# Patient Record
Sex: Female | Born: 1990 | Race: White | Hispanic: No | Marital: Single | Smoking: Never smoker
Health system: Southern US, Community
[De-identification: ages and names within clinical notes are randomized; demographics above are authoritative.]

## PROBLEM LIST (undated history)

## (undated) ENCOUNTER — Inpatient Hospital Stay: Payer: Self-pay

## (undated) DIAGNOSIS — F909 Attention-deficit hyperactivity disorder, unspecified type: Secondary | ICD-10-CM

## (undated) DIAGNOSIS — F1910 Other psychoactive substance abuse, uncomplicated: Secondary | ICD-10-CM

## (undated) DIAGNOSIS — F329 Major depressive disorder, single episode, unspecified: Secondary | ICD-10-CM

## (undated) DIAGNOSIS — F419 Anxiety disorder, unspecified: Secondary | ICD-10-CM

## (undated) DIAGNOSIS — Z8619 Personal history of other infectious and parasitic diseases: Secondary | ICD-10-CM

## (undated) DIAGNOSIS — F32A Depression, unspecified: Secondary | ICD-10-CM

## (undated) DIAGNOSIS — J45909 Unspecified asthma, uncomplicated: Secondary | ICD-10-CM

## (undated) DIAGNOSIS — A6000 Herpesviral infection of urogenital system, unspecified: Secondary | ICD-10-CM

## (undated) HISTORY — DX: Personal history of other infectious and parasitic diseases: Z86.19

## (undated) HISTORY — DX: Depression, unspecified: F32.A

## (undated) HISTORY — DX: Major depressive disorder, single episode, unspecified: F32.9

## (undated) HISTORY — DX: Unspecified asthma, uncomplicated: J45.909

## (undated) HISTORY — DX: Anxiety disorder, unspecified: F41.9

## (undated) HISTORY — DX: Attention-deficit hyperactivity disorder, unspecified type: F90.9

---
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
## 2012-03-06 LAB — COMPREHENSIVE METABOLIC PANEL
Albumin: 3.8 g/dL (ref 3.8–5.6)
Alkaline Phosphatase: 102 U/L (ref 82–169)
Anion Gap: 6 — ABNORMAL LOW (ref 7–16)
BUN: 11 mg/dL (ref 9–21)
Bilirubin,Total: 0.3 mg/dL (ref 0.2–1.0)
Calcium, Total: 8.7 mg/dL — ABNORMAL LOW (ref 9.0–10.7)
Chloride: 111 mmol/L — ABNORMAL HIGH (ref 97–107)
Co2: 27 mmol/L — ABNORMAL HIGH (ref 16–25)
Creatinine: 0.71 mg/dL (ref 0.60–1.30)
EGFR (African American): >60
EGFR (Non-African Amer.): >60
Glucose: 81 mg/dL (ref 65–99)
Osmolality: 285 (ref 275–301)
Potassium: 3.9 mmol/L (ref 3.3–4.7)
SGOT(AST): 26 U/L (ref 0–26)
SGPT (ALT): 18 U/L (ref 12–78)
Sodium: 144 mmol/L — ABNORMAL HIGH (ref 132–141)
Total Protein: 7 g/dL (ref 6.4–8.6)

## 2012-03-06 LAB — ETHANOL
Ethanol %: <0.003 % (ref 0.000–0.080)
Ethanol: <3 mg/dL

## 2012-03-06 LAB — DRUG SCREEN, URINE

## 2012-03-06 LAB — CBC
HCT: 37 % (ref 35.0–47.0)
HGB: 13 g/dL (ref 12.0–16.0)
MCH: 30.7 pg (ref 26.0–34.0)
MCHC: 35.2 g/dL (ref 32.0–36.0)
MCV: 87 fL (ref 80–100)
Platelet: 255 10*3/uL (ref 150–440)
RBC: 4.24 10*6/uL (ref 3.80–5.20)
RDW: 12.9 % (ref 11.5–14.5)
WBC: 8.3 10*3/uL (ref 3.6–11.0)

## 2012-03-06 LAB — ACETAMINOPHEN LEVEL: Acetaminophen: <2 ug/mL

## 2012-03-06 LAB — TSH: Thyroid Stimulating Horm: 0.21 u[IU]/mL — ABNORMAL LOW

## 2012-03-06 LAB — SALICYLATE LEVEL: Salicylates, Serum: <1.7 mg/dL

## 2012-03-07 ENCOUNTER — Inpatient Hospital Stay: Payer: Self-pay | Admitting: Psychiatry

## 2012-04-12 ENCOUNTER — Emergency Department: Payer: Self-pay | Admitting: Emergency Medicine

## 2012-04-12 LAB — URINALYSIS, COMPLETE
Bacteria: NONE SEEN
Bilirubin,UR: NEGATIVE
Blood: NEGATIVE
Glucose,UR: NEGATIVE mg/dL (ref 0–75)
Ketone: NEGATIVE
Leukocyte Esterase: NEGATIVE
Nitrite: NEGATIVE
Ph: 6 (ref 4.5–8.0)
Protein: NEGATIVE
RBC,UR: 1 /HPF (ref 0–5)
Specific Gravity: 1.02 (ref 1.003–1.030)
Squamous Epithelial: 5
WBC UR: 2 /HPF (ref 0–5)

## 2012-04-12 LAB — COMPREHENSIVE METABOLIC PANEL
Albumin: 4.2 g/dL (ref 3.8–5.6)
Alkaline Phosphatase: 130 U/L (ref 82–169)
Anion Gap: 3 — ABNORMAL LOW (ref 7–16)
BUN: 12 mg/dL (ref 9–21)
Bilirubin,Total: 0.3 mg/dL (ref 0.2–1.0)
Calcium, Total: 9.2 mg/dL (ref 9.0–10.7)
Chloride: 108 mmol/L — ABNORMAL HIGH (ref 97–107)
Co2: 28 mmol/L — ABNORMAL HIGH (ref 16–25)
Creatinine: 0.84 mg/dL (ref 0.60–1.30)
EGFR (African American): >60
EGFR (Non-African Amer.): >60
Glucose: 66 mg/dL (ref 65–99)
Osmolality: 275 (ref 275–301)
Potassium: 4.1 mmol/L (ref 3.3–4.7)
SGOT(AST): 19 U/L (ref 0–26)
SGPT (ALT): 15 U/L (ref 12–78)
Sodium: 139 mmol/L (ref 132–141)
Total Protein: 7.8 g/dL (ref 6.4–8.6)

## 2012-04-12 LAB — CBC
HCT: 41.1 % (ref 35.0–47.0)
HGB: 14.1 g/dL (ref 12.0–16.0)
MCH: 30.2 pg (ref 26.0–34.0)
MCHC: 34.4 g/dL (ref 32.0–36.0)
MCV: 88 fL (ref 80–100)
Platelet: 307 10*3/uL (ref 150–440)
RBC: 4.68 10*6/uL (ref 3.80–5.20)
RDW: 13.4 % (ref 11.5–14.5)
WBC: 10.9 10*3/uL (ref 3.6–11.0)

## 2012-04-12 LAB — LIPASE, BLOOD: Lipase: 165 U/L (ref 73–393)

## 2012-04-16 ENCOUNTER — Ambulatory Visit: Payer: Self-pay | Admitting: Family Medicine

## 2012-06-17 ENCOUNTER — Emergency Department: Payer: Self-pay | Admitting: Emergency Medicine

## 2012-06-18 LAB — URINALYSIS, COMPLETE
Bacteria: NONE SEEN
Bilirubin,UR: NEGATIVE
Blood: NEGATIVE
Glucose,UR: NEGATIVE mg/dL (ref 0–75)
Ketone: NEGATIVE
Leukocyte Esterase: NEGATIVE
Nitrite: NEGATIVE
Ph: 7 (ref 4.5–8.0)
Protein: NEGATIVE
RBC,UR: 3 /HPF (ref 0–5)
Specific Gravity: 1.018 (ref 1.003–1.030)
Squamous Epithelial: 1
WBC UR: <1 /HPF (ref 0–5)

## 2012-06-18 LAB — PREGNANCY, URINE: Pregnancy Test, Urine: NEGATIVE m[IU]/mL

## 2012-06-18 LAB — WET PREP, GENITAL

## 2012-06-27 ENCOUNTER — Emergency Department: Payer: Self-pay | Admitting: Emergency Medicine

## 2012-06-27 LAB — COMPREHENSIVE METABOLIC PANEL
Albumin: 4.1 g/dL (ref 3.8–5.6)
Alkaline Phosphatase: 114 U/L (ref 82–169)
Anion Gap: 5 — ABNORMAL LOW (ref 7–16)
BUN: 10 mg/dL (ref 7–18)
Bilirubin,Total: 0.4 mg/dL (ref 0.2–1.0)
Calcium, Total: 9 mg/dL (ref 9.0–10.7)
Chloride: 106 mmol/L (ref 98–107)
Co2: 28 mmol/L (ref 21–32)
Creatinine: 0.95 mg/dL (ref 0.60–1.30)
EGFR (African American): >60
EGFR (Non-African Amer.): >60
Glucose: 103 mg/dL — ABNORMAL HIGH (ref 65–99)
Osmolality: 277 (ref 275–301)
Potassium: 4.4 mmol/L (ref 3.5–5.1)
SGOT(AST): 19 U/L (ref 0–26)
SGPT (ALT): 20 U/L (ref 12–78)
Sodium: 139 mmol/L (ref 136–145)
Total Protein: 7.7 g/dL (ref 6.4–8.6)

## 2012-06-27 LAB — URINALYSIS, COMPLETE
Bacteria: NONE SEEN
Bilirubin,UR: NEGATIVE
Blood: NEGATIVE
Glucose,UR: NEGATIVE mg/dL (ref 0–75)
Ketone: NEGATIVE
Leukocyte Esterase: NEGATIVE
Nitrite: NEGATIVE
Ph: 5 (ref 4.5–8.0)
Protein: NEGATIVE
RBC,UR: 2 /HPF (ref 0–5)
Specific Gravity: 1.02 (ref 1.003–1.030)
Squamous Epithelial: 20
WBC UR: 2 /HPF (ref 0–5)

## 2012-06-27 LAB — CBC
HCT: 42.2 % (ref 35.0–47.0)
HGB: 14.4 g/dL (ref 12.0–16.0)
MCH: 30.6 pg (ref 26.0–34.0)
MCHC: 34 g/dL (ref 32.0–36.0)
MCV: 90 fL (ref 80–100)
Platelet: 282 10*3/uL (ref 150–440)
RBC: 4.69 10*6/uL (ref 3.80–5.20)
RDW: 13.8 % (ref 11.5–14.5)
WBC: 6.7 10*3/uL (ref 3.6–11.0)

## 2012-06-27 LAB — LIPASE, BLOOD: Lipase: 146 U/L (ref 73–393)

## 2012-06-27 LAB — PREGNANCY, URINE: Pregnancy Test, Urine: NEGATIVE m[IU]/mL

## 2012-06-27 LAB — WET PREP, GENITAL

## 2012-09-26 ENCOUNTER — Emergency Department: Payer: Self-pay | Admitting: Unknown Physician Specialty

## 2012-11-05 ENCOUNTER — Emergency Department: Payer: Self-pay | Admitting: Emergency Medicine

## 2012-11-05 LAB — HCG, QUANTITATIVE, PREGNANCY: Beta Hcg, Quant.: 1045 m[IU]/mL — ABNORMAL HIGH

## 2012-11-13 ENCOUNTER — Emergency Department: Payer: Self-pay | Admitting: Unknown Physician Specialty

## 2012-11-13 LAB — URINALYSIS, COMPLETE
Bilirubin,UR: NEGATIVE
Glucose,UR: NEGATIVE mg/dL (ref 0–75)
Ketone: NEGATIVE
Leukocyte Esterase: NEGATIVE
Nitrite: NEGATIVE
Ph: 6 (ref 4.5–8.0)
Protein: NEGATIVE
RBC,UR: 14 /HPF (ref 0–5)
Specific Gravity: 1.01 (ref 1.003–1.030)
Squamous Epithelial: 15
WBC UR: 6 /HPF (ref 0–5)

## 2012-11-13 LAB — CBC
HCT: 41 % (ref 35.0–47.0)
HGB: 14.2 g/dL (ref 12.0–16.0)
MCH: 31 pg (ref 26.0–34.0)
MCHC: 34.7 g/dL (ref 32.0–36.0)
MCV: 89 fL (ref 80–100)
Platelet: 272 10*3/uL (ref 150–440)
RBC: 4.59 10*6/uL (ref 3.80–5.20)
RDW: 12.8 % (ref 11.5–14.5)
WBC: 9 10*3/uL (ref 3.6–11.0)

## 2012-11-13 LAB — HCG, QUANTITATIVE, PREGNANCY: Beta Hcg, Quant.: 1398 m[IU]/mL — ABNORMAL HIGH

## 2013-01-23 ENCOUNTER — Ambulatory Visit: Payer: Self-pay | Admitting: Obstetrics and Gynecology

## 2013-02-02 ENCOUNTER — Ambulatory Visit: Payer: Self-pay | Admitting: Obstetrics and Gynecology

## 2013-02-27 ENCOUNTER — Emergency Department: Payer: Self-pay | Admitting: Emergency Medicine

## 2013-02-28 ENCOUNTER — Emergency Department: Payer: Self-pay | Admitting: Emergency Medicine

## 2013-02-28 LAB — CBC
HCT: 37.5 % (ref 35.0–47.0)
HGB: 13.3 g/dL (ref 12.0–16.0)
MCH: 31.1 pg (ref 26.0–34.0)
MCHC: 35.4 g/dL (ref 32.0–36.0)
MCV: 88 fL (ref 80–100)
Platelet: 267 10*3/uL (ref 150–440)
RBC: 4.27 10*6/uL (ref 3.80–5.20)
RDW: 12.1 % (ref 11.5–14.5)
WBC: 9.2 10*3/uL (ref 3.6–11.0)

## 2013-02-28 LAB — URINALYSIS, COMPLETE
Bacteria: NONE SEEN
Bilirubin,UR: NEGATIVE
Blood: NEGATIVE
Glucose,UR: NEGATIVE mg/dL (ref 0–75)
Ketone: NEGATIVE
Leukocyte Esterase: NEGATIVE
Nitrite: NEGATIVE
Ph: 6 (ref 4.5–8.0)
Protein: NEGATIVE
RBC,UR: <1 /HPF (ref 0–5)
Specific Gravity: 1.01 (ref 1.003–1.030)
Squamous Epithelial: 1
WBC UR: <1 /HPF (ref 0–5)

## 2013-02-28 LAB — WET PREP, GENITAL

## 2013-02-28 LAB — HCG, QUANTITATIVE, PREGNANCY: Beta Hcg, Quant.: 64000 m[IU]/mL — ABNORMAL HIGH

## 2013-03-01 LAB — GC/CHLAMYDIA PROBE AMP

## 2013-04-14 ENCOUNTER — Emergency Department: Payer: Self-pay | Admitting: Emergency Medicine

## 2013-04-15 LAB — HCG, QUANTITATIVE, PREGNANCY: Beta Hcg, Quant.: 23910 m[IU]/mL — ABNORMAL HIGH

## 2013-06-15 ENCOUNTER — Observation Stay: Payer: Self-pay

## 2013-08-04 ENCOUNTER — Observation Stay: Payer: Self-pay | Admitting: Obstetrics and Gynecology

## 2013-08-04 LAB — URINALYSIS, COMPLETE
Bacteria: NONE SEEN
Bilirubin,UR: NEGATIVE
Blood: NEGATIVE
Glucose,UR: NEGATIVE mg/dL (ref 0–75)
Ketone: NEGATIVE
Leukocyte Esterase: NEGATIVE
Nitrite: NEGATIVE
Ph: 7 (ref 4.5–8.0)
Protein: NEGATIVE
RBC,UR: 1 /HPF (ref 0–5)
Specific Gravity: 1.012 (ref 1.003–1.030)
Squamous Epithelial: 2
WBC UR: 4 /HPF (ref 0–5)

## 2013-08-06 LAB — URINE CULTURE

## 2013-08-27 ENCOUNTER — Observation Stay: Payer: Self-pay

## 2013-09-13 ENCOUNTER — Observation Stay: Payer: Self-pay

## 2013-09-14 ENCOUNTER — Observation Stay: Payer: Self-pay

## 2013-09-27 ENCOUNTER — Observation Stay: Payer: Self-pay

## 2013-09-29 ENCOUNTER — Inpatient Hospital Stay: Payer: Self-pay

## 2013-09-29 LAB — CBC WITH DIFFERENTIAL/PLATELET
Basophil #: 0.1 10*3/uL (ref 0.0–0.1)
Basophil %: 0.9 %
Eosinophil #: 0.3 10*3/uL (ref 0.0–0.7)
Eosinophil %: 2.3 %
HCT: 36.4 % (ref 35.0–47.0)
HGB: 12.4 g/dL (ref 12.0–16.0)
Lymphocyte #: 2 10*3/uL (ref 1.0–3.6)
Lymphocyte %: 13.3 %
MCH: 29.8 pg (ref 26.0–34.0)
MCHC: 33.9 g/dL (ref 32.0–36.0)
MCV: 88 fL (ref 80–100)
Monocyte #: 0.7 x10 3/mm (ref 0.2–0.9)
Monocyte %: 4.6 %
Neutrophil #: 11.8 10*3/uL — ABNORMAL HIGH (ref 1.4–6.5)
Neutrophil %: 78.9 %
Platelet: 224 10*3/uL (ref 150–440)
RBC: 4.14 10*6/uL (ref 3.80–5.20)
RDW: 14.3 % (ref 11.5–14.5)
WBC: 15 10*3/uL — ABNORMAL HIGH (ref 3.6–11.0)

## 2013-09-30 LAB — GC/CHLAMYDIA PROBE AMP

## 2013-10-01 LAB — HEMATOCRIT: HCT: 33.9 % — ABNORMAL LOW (ref 35.0–47.0)

## 2013-10-18 IMAGING — US US OB < 14 WEEKS - US OB TV
1 series · 14 of 28 positions shown · non-contrast
Comparison: none

REASON FOR EXAM: Dates and Viablility
COMMENTS:

[Series 1: us ob < 14 weeks - us ob tv · 0.23mm/px · 14 of 73 slices shown]
[im 3/73]
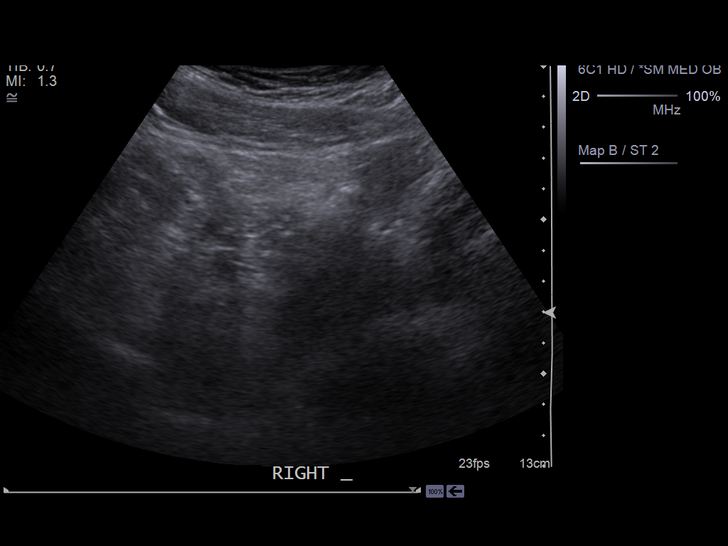
[im 9/73]
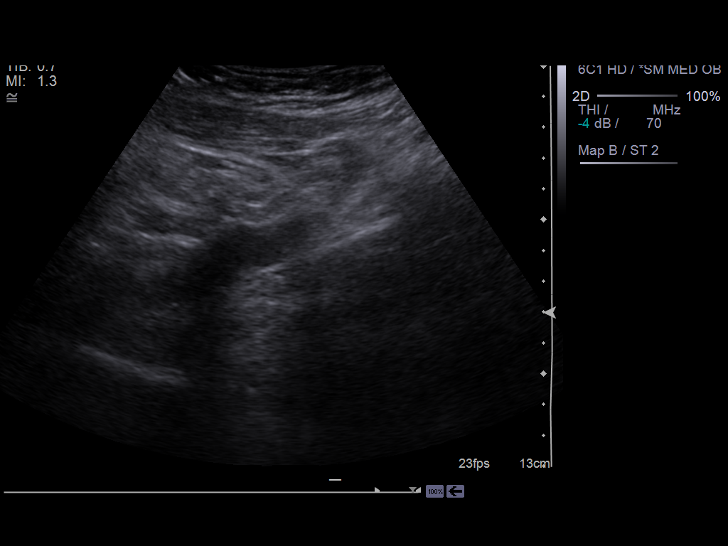
[im 14/73]
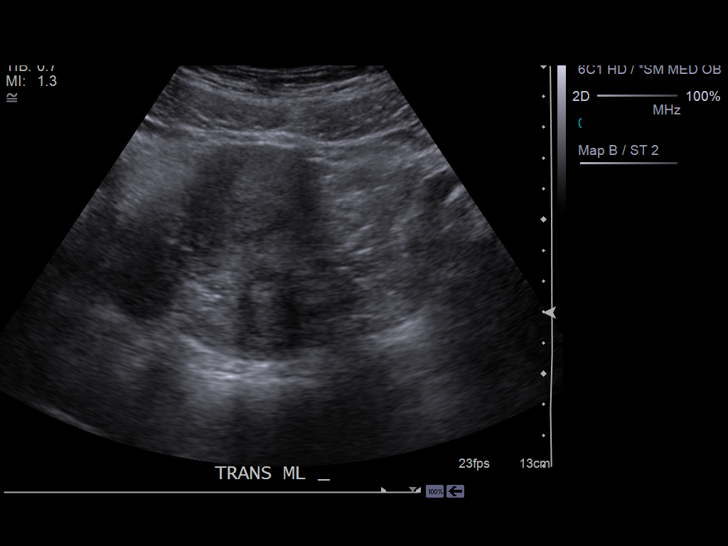
[im 19/73]
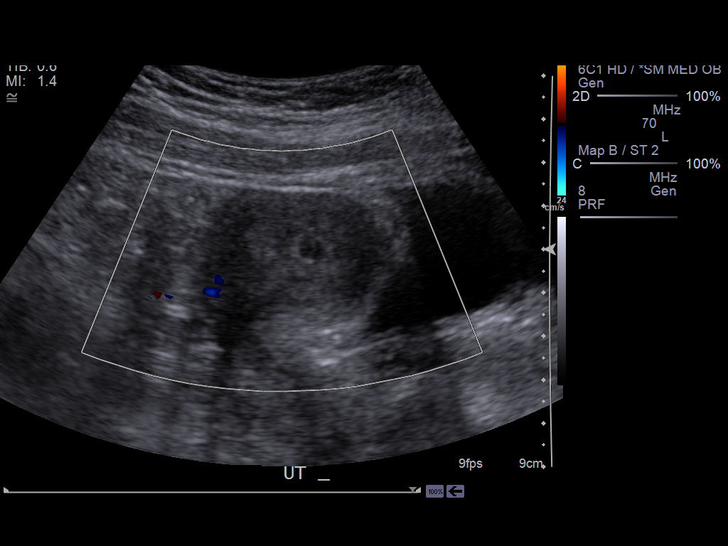
[im 25/73]
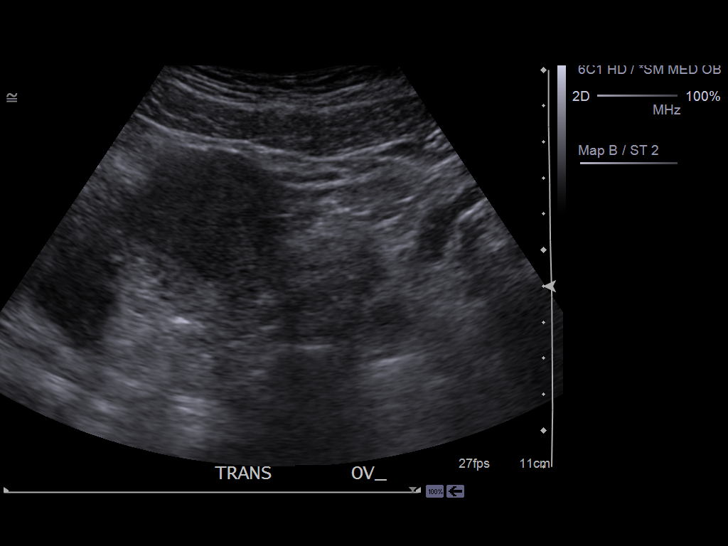
[im 30/73]
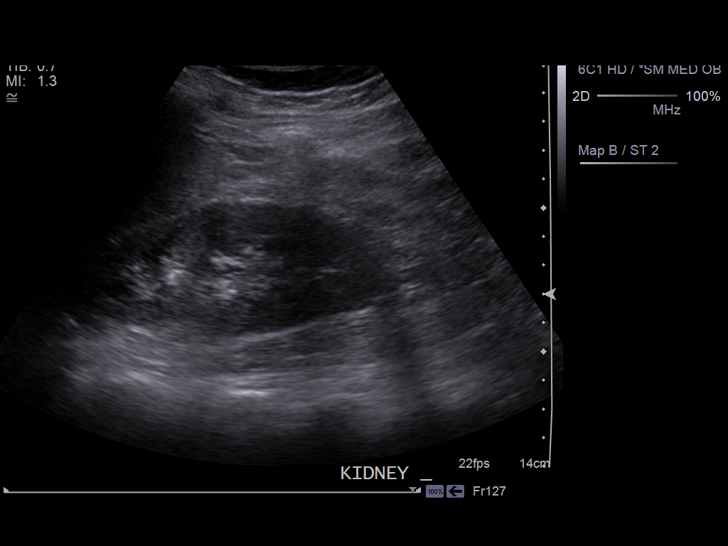
[im 35/73]
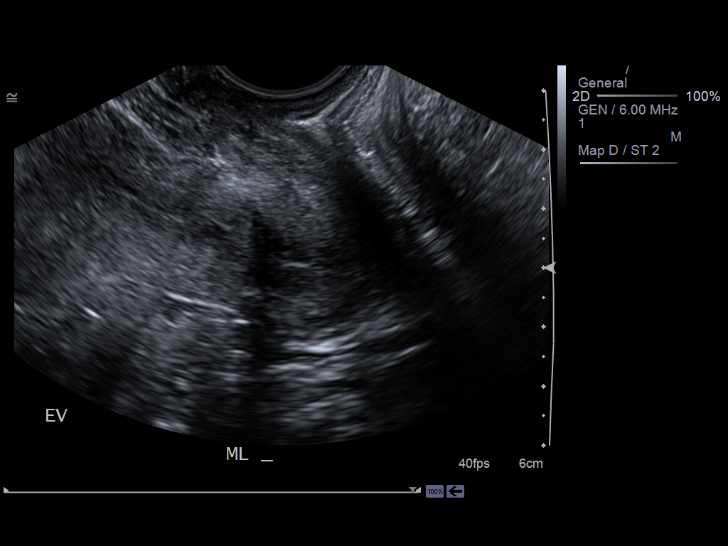
[im 41/73]
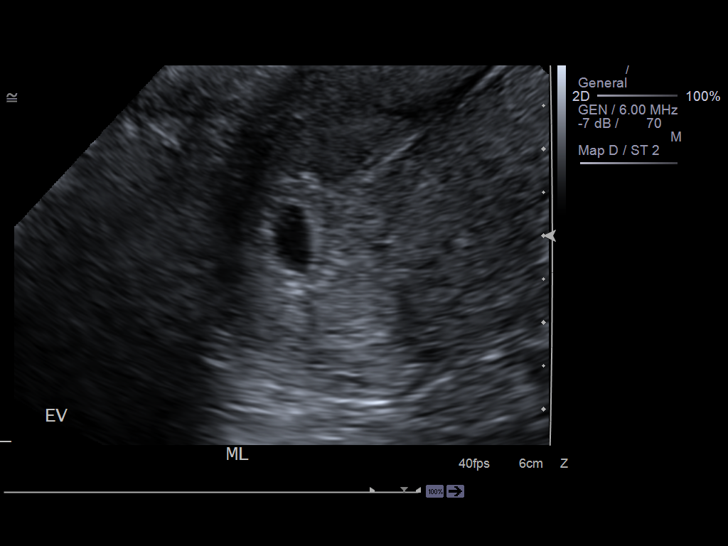
[im 46/73]
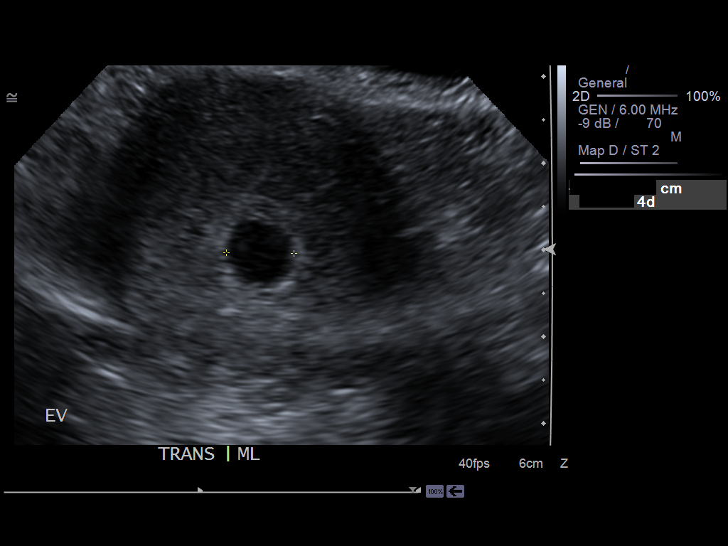
[im 51/73]
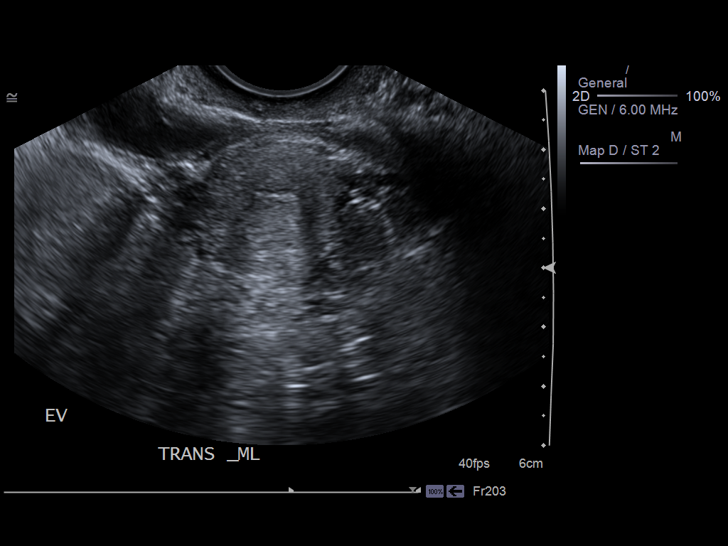
[im 57/73]
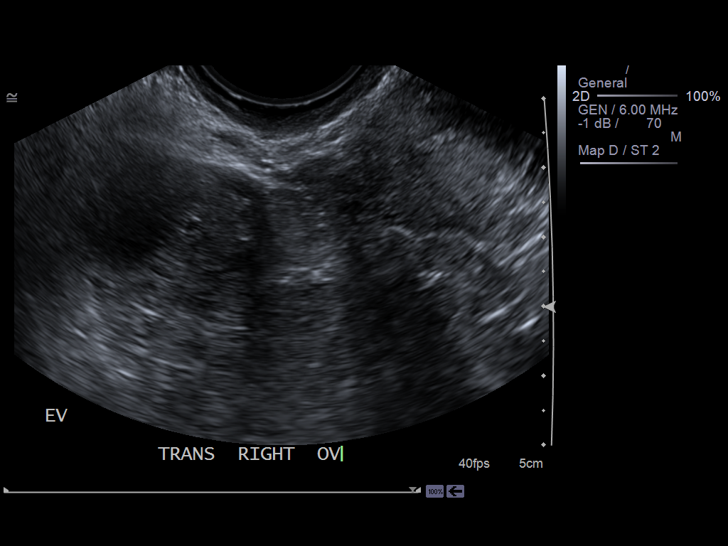
[im 62/73]
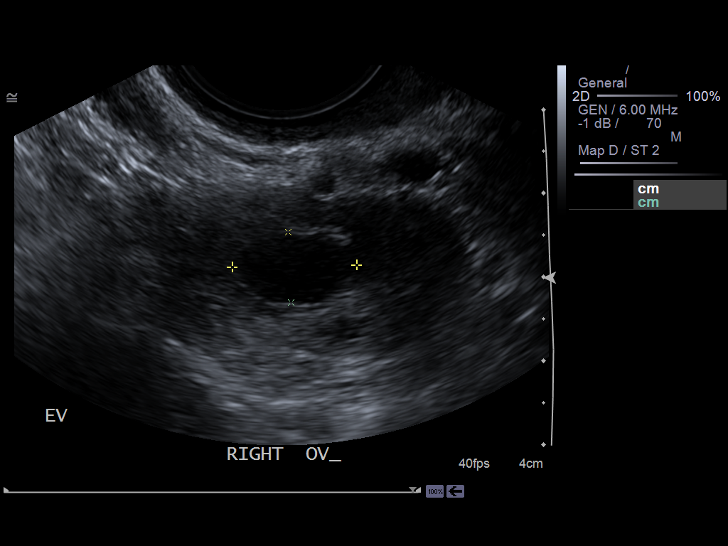
[im 67/73]
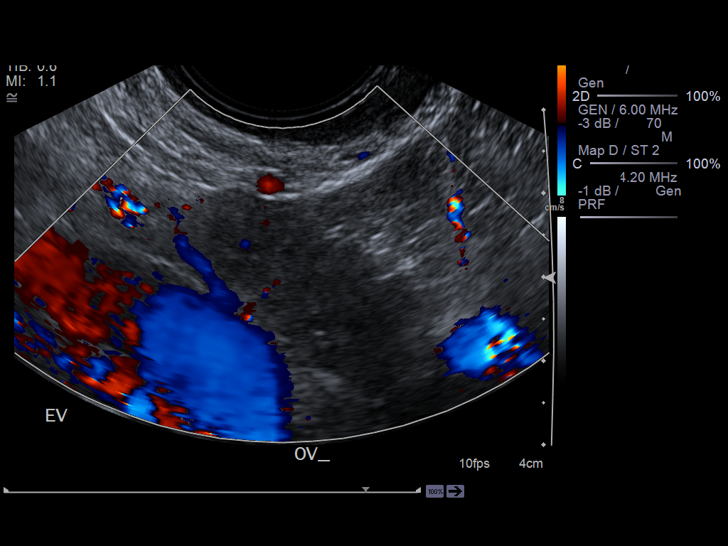
[im 73/73]
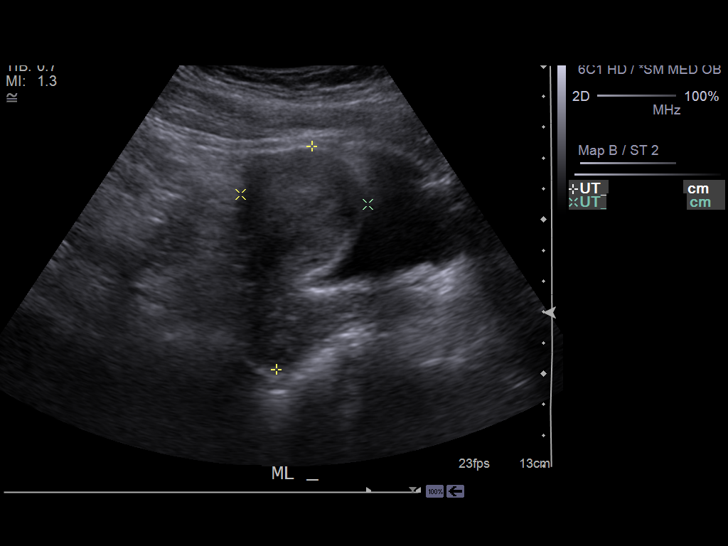

[14 of 28 positions shown; findings below may reference images not displayed]

PROCEDURE:     WHIT - WHIT OB LESS THAN 14 WEEKS W/TV  - January 23, 2013  [DATE]

RESULT:     Sonographic assessment of the pelvis is performed with
transabdominal and endovaginal scanning nabothian cyst cervix measuring up
to 6 mm. There is an appearance consistent with a gestational sac in the
endometrial canal with a yolk sac of 1.9 mm and a gestational sac diameter
measuring up to 6.7 cm mean which is consistent with a 5 week 3 day
gestation. No fetal pole is evident. Serial quantitative beta-hCG
examinations are recommended. Both ovaries are seen. There is a right
ovarian cyst of 1.48 x 0.4 x 1.18 cm. The right ovary measures 2.92 x 2.07 x
3.4 to centimeters. The left ovary measures 1.7 x 1.37 x 3.02 cm. There is
no free fluid evident.
IMPRESSION: Findings suggest early gestational sac in the endometrium
with a yolk sac visualized. No fetal pole is evident. Close clinical and
laboratory correlation and followup is recommended. Small right ovarian cyst
or follicle. Nabothian cyst present.

[REDACTED]

## 2014-02-27 ENCOUNTER — Encounter (HOSPITAL_COMMUNITY): Payer: Self-pay | Admitting: Emergency Medicine

## 2014-02-27 ENCOUNTER — Emergency Department (HOSPITAL_COMMUNITY)
Admission: EM | Admit: 2014-02-27 | Discharge: 2014-02-27 | Disposition: A | Discharge status: Home / Self Care | Payer: Medicaid Other | Attending: Emergency Medicine | Admitting: Emergency Medicine

## 2014-02-27 DIAGNOSIS — R300 Dysuria: Secondary | ICD-10-CM | POA: Diagnosis not present

## 2014-02-27 DIAGNOSIS — Z720 Tobacco use: Secondary | ICD-10-CM | POA: Diagnosis not present

## 2014-02-27 DIAGNOSIS — R350 Frequency of micturition: Secondary | ICD-10-CM | POA: Diagnosis not present

## 2014-02-27 DIAGNOSIS — M545 Low back pain, unspecified: Secondary | ICD-10-CM

## 2014-02-27 DIAGNOSIS — Z3202 Encounter for pregnancy test, result negative: Secondary | ICD-10-CM | POA: Insufficient documentation

## 2014-02-27 DIAGNOSIS — R3915 Urgency of urination: Secondary | ICD-10-CM | POA: Diagnosis not present

## 2014-02-27 DIAGNOSIS — Z8619 Personal history of other infectious and parasitic diseases: Secondary | ICD-10-CM | POA: Insufficient documentation

## 2014-02-27 HISTORY — DX: Herpesviral infection of urogenital system, unspecified: A60.00

## 2014-02-27 LAB — URINE MICROSCOPIC-ADD ON

## 2014-02-27 LAB — URINALYSIS, ROUTINE W REFLEX MICROSCOPIC
Bilirubin Urine: NEGATIVE
Glucose, UA: NEGATIVE mg/dL
Ketones, ur: NEGATIVE mg/dL
Leukocytes, UA: NEGATIVE
Nitrite: NEGATIVE
Protein, ur: NEGATIVE mg/dL
Specific Gravity, Urine: 1.033 — ABNORMAL HIGH (ref 1.005–1.030)
Urobilinogen, UA: 1 mg/dL (ref 0.0–1.0)
pH: 5.5 (ref 5.0–8.0)

## 2014-02-27 MED ORDER — TRAMADOL HCL 50 MG PO TABS
50.0000 mg | ORAL_TABLET | Freq: Once | ORAL | Status: DC
  Filled 2014-02-27: qty 1

## 2014-02-27 MED ORDER — METHOCARBAMOL 500 MG PO TABS
500.0000 mg | ORAL_TABLET | Freq: Once | ORAL | Status: AC
  Administered 2014-02-27: 500 mg via ORAL
  Filled 2014-02-27: qty 1

## 2014-02-27 MED ORDER — HYDROCODONE-ACETAMINOPHEN 5-325 MG PO TABS
2.0000 | ORAL_TABLET | Freq: Once | ORAL | Status: DC
  Filled 2014-02-27: qty 2

## 2014-02-27 NOTE — ED Notes (Signed)
Pt. Stated, I've had back pain and pt. Points to her flank area.

## 2014-02-27 NOTE — ED Notes (Signed)
Pt left without paperwork. Provider aware

## 2014-02-27 NOTE — ED Provider Notes (Signed)
CSN: [id number]     Arrival date & time 02/27/14  0845 History   First MD Initiated Contact with Patient 02/27/14 [phone]     Chief Complaint  Patient presents with  . Back Pain  . Flank Pain     (Consider location/radiation/quality/duration/timing/severity/associated sxs/prior Treatment) HPI Comments: Patient presents today with a chief complaint of left lower back pain.  She reports that she has had the pain intermittently over the past 5 months, but the pain has worsened over the past week.  She states that the pain has been constant over the past week.  She states that one week ago she moved and lifted heavy boxes and furniture.  Pain does not radiate.  She has not taken anything for her pain.  Pain is worse with movement.  She also reports dysuria, increased urinary frequency, and urgency.  She denies fever, chills, nausea, vomiting, hematuria, vaginal discharge, abdominal pain, bowel/bladder incontinence, numbness, or tingling.    Patient is a 23 y.o. female presenting with back pain and flank pain. The history is provided by the patient.  Back Pain Flank Pain    Past Medical History  Diagnosis Date  . Herpes genitalia    History reviewed. No pertinent past surgical history. No family history on file. History  Substance Use Topics  . Smoking status: Current Some Day Smoker  . Smokeless tobacco: Not on file  . Alcohol Use: No   OB History   Grav Para Term Preterm Abortions TAB SAB Ect Mult Living                 Review of Systems  Genitourinary: Positive for flank pain.  Musculoskeletal: Positive for back pain.  All other systems reviewed and are negative.     Allergies  Review of patient's allergies indicates not on file.  Home Medications   Prior to Admission medications   Not on File   BP 112/67  Pulse 85  Temp(Src) 98.1 F (36.7 C) (Oral)  Resp 20  SpO2 96%  LMP 02/13/2014 Physical Exam  Nursing note and vitals reviewed. Constitutional: She appears  well-developed and well-nourished.  HENT:  Head: Normocephalic and atraumatic.  Mouth/Throat: Oropharynx is clear and moist.  Neck: Normal range of motion. Neck supple.  Cardiovascular: Normal rate, regular rhythm and normal heart sounds.   Pulmonary/Chest: Effort normal and breath sounds normal.  Abdominal: Soft. Bowel sounds are normal. She exhibits no distension and no mass. There is no tenderness. There is no rebound and no guarding.  Left CVA tenderness  Musculoskeletal:       Cervical back: She exhibits normal range of motion, no tenderness, no bony tenderness, no swelling, no edema and no deformity.       Thoracic back: She exhibits normal range of motion, no tenderness, no bony tenderness, no swelling, no edema and no deformity.       Lumbar back: She exhibits tenderness and bony tenderness. She exhibits normal range of motion, no swelling, no edema and no deformity.  Lower back pain worsened with flexion, extension, and rotation of the back  Neurological: She is alert. She has normal strength. No sensory deficit. Gait normal.  Reflex Scores:      Patellar reflexes are 2+ on the right side and 2+ on the left side. Skin: Skin is warm and dry.  Psychiatric: She has a normal mood and affect.    ED Course  Procedures (including critical care time) Labs Review Labs Reviewed  URINALYSIS, ROUTINE W REFLEX  MICROSCOPIC - Abnormal; Notable for the following:    Specific Gravity, Urine 1.033 (*)    Hgb urine dipstick SMALL (*)    All other components within normal limits  URINE MICROSCOPIC-ADD ON - Abnormal; Notable for the following:    Squamous Epithelial / LPF FEW (*)    Bacteria, UA FEW (*)    All other components within normal limits  POC URINE PREG, ED    Imaging Review No results found.   EKG Interpretation None     10:24 AM Patient became agitated when she was told that she would not be getting Suboxone in the ED.  Patient offered Tramadol, but she stated that it  would not help.  She then left prior to completion of discharge instructions. MDM   Final diagnoses:  None   Patient presenting with left lower and midline lower back pain.  UA negative.  Pain worse with movement.  Onset of pain after lifting heavy objects.  Suspect muscle strain.   No neurological deficits and normal neuro exam.  Patient able to ambulate without difficulty.  No loss of bowel or bladder control.  No concern for cauda equina.  No fever, night sweats, weight loss, h/o cancer, IVDU.  Patient requesting Suboxone for pain.  Patient offered Tramadol and Robaxin.  She then became very agitated and left the ED prior to completion of discharge paperwork.  Patient ambulated without any difficulty.  Patient stable for discharge.        Santiago GladHeather Laisure, PA-C 02/27/14 1030

## 2014-02-27 NOTE — ED Provider Notes (Signed)
Medical screening examination/treatment/procedure(s) were performed by non-physician practitioner and as supervising physician I was immediately available for consultation/collaboration.   EKG Interpretation None        Scott Zackowski, MD 02/27/14 1647 

## 2014-08-19 ENCOUNTER — Emergency Department
Admit: 2014-08-19 | Disposition: A | Discharge status: Home / Self Care | Payer: Self-pay | Admitting: Emergency Medicine

## 2014-09-07 NOTE — H&P (Signed)
PATIENT NAME:  Tammy FujitaWOODLIEF, Terrilee D MR#:  [id number] DATE OF BIRTH:  1991-04-21  DATE OF ADMISSION:  03/07/2012  IDENTIFYING INFORMATION AND CHIEF COMPLAINT: This is an 24 year old woman with a past history of substance abuse and attention deficit/hyperactivity disorder who was petitioned for admission by her grandmother with an allegation of suicidal threats.   CHIEF COMPLAINT: "I did not try to hurt myself."   HISTORY OF PRESENT ILLNESS: Information obtained from the patient and the chart and also from secondhand testimony of the grandmother. The patient was brought to the Emergency Room yesterday because the patient's grandmother had filed a commitment petition alleging that the patient threatened to kill herself. The patient states that she was riding in a car with her grandmother when they happened to see a man on the street whom the patient had at one point dated. The patient's grandmother disapproves of the fact that the patient dates men of African American background and made some racially disparage comments. This led to an argument between them. The grandmother then apparently decided that she would file a commitment petition. Patient denies that she made any sort of suicidal statements at all or attempted to hurt herself. Grandmother has now called the ward and called my office and stated that in fact she had lied on the petition and that the patient really did not make any suicidal threats. Grandmother admits that she was just angry at the patient. Patient reports that recently her mood has been feeling fairly good. She feels happy with her life. She usually sleeps fairly well. Denies any suicidal or homicidal ideation. Denies any psychotic symptoms. Denies any medical problems. She says that she has been getting along well with her boyfriend which is who she lives with. She claims to be compliant with her medication but is somewhat confused about what it is supposed to be. Apparently she has been  prescribed Suboxone and Vyvanse and Xanax at various times but it also appears that she may have run out of her medicine. It also appears that having run out of it she may get some off the street and abuse it. Patient never really has straight story about this. She denies, however, having overdosed on anything or trying to overuse anything intentionally.   PAST PSYCHIATRIC HISTORY: Apparently has had some degree of behavior problem since childhood. Has been treated for attention deficit/hyperactivity disorder in the past and is supposed to currently be on Vyvanse. Has a history of Percocet abuse in the past and is currently prescribed Suboxone for treatment. She has been prescribed either Klonopin or Xanax by some practitioner, it is not entirely clear for what. Patient has never actually been admitted to the hospital before here. She was once previously about a year ago sent to the Emergency Room under similar circumstances and at that time was also released when her grandmother recanted the petition. Patient denies that she has ever tried to kill herself in the past. Denies any history of violence.   SUBSTANCE ABUSE HISTORY: Says she drinks only rarely and not abusively. Admits that she smokes marijuana fairly regularly. Does not see it as a problem. Says she used to abuse Percocet but now only takes Suboxone. Denies other drug abuse.   PAST MEDICAL HISTORY: Says she has mild asthma, does not use anything for it.   SOCIAL HISTORY: She gets a disability check. She does not work. She lives with her boyfriend. She takes care of a young child who is actually the child of  a friend of hers. Does not have any biological children of her own. She was raised by her grandmother all of her life and appears to still have a very enmeshed and somewhat toxic relationship with her grandmother.   CURRENT MEDICATIONS: Doses cannot be confirmed. She claims she takes Suboxone and Xanax and Vyvanse. Does not know any of the  doses.   ALLERGIES: Penicillin.   REVIEW OF SYSTEMS: Denies depression. Denies suicidal or homicidal ideation. Denies thought disorder or loosening of associations. Denies paranoia. Denies sleep disturbance. Denies any medical problems. The rest of the review of systems all generally negative.   MENTAL STATUS EXAM: Reasonably well groomed young woman, looks her stated age. Neatly but casually dressed. Eye contact is good. Psychomotor activity normal. Speech is a little rapid but not pressured. Affect is mildly anxious but reactive. Mood is stated as being fine. Thoughts can be a little circumstantial but not disorganized, not bizarre, no flight of ideas. Generally able to stay well on the topic. No sign of delusional or paranoid thinking. Denies auditory or visual hallucinations. She denies any suicidal or homicidal ideation. Indicates there are multiple things in her life that she enjoys and looks forward to. Has reasonable judgment and insight. Probably has average intelligence. Short and long-term memory grossly intact. Alert and oriented x4.   LABORATORY, DIAGNOSTIC AND RADIOLOGICAL DATA: Drug screen was positive for opiates, cannabis, and benzodiazepines. TSH was slightly low at 0.21. CBC normal. Alcohol undetectable. Chemistry profile shows a sodium slightly elevated at 144, chloride elevated at 111, calcium elevated at 27. Bilirubin normal. Calcium low at 8.7.   ASSESSMENT: This is an 24 year old woman with a history of substance abuse and attention deficit/hyperactivity disorder who it appears is in a chaotic relationship with a lot of poor boundaries and emotional agitation in her life but is not presenting with suicidal ideation, an acute mood episode or a psychotic episode. She may or may not be abusing street medicine but it difficult to prove sense she also is probably prescribed them. She does use marijuana but denies that it is an addiction or abuse pattern. Patient currently had outpatient  treatment in place and has a safe place to live. Her family, specifically the person who petitioned her, are already recanting their history. Patient at this point does not require further inpatient hospitalization.   TREATMENT PLAN: I discussed this with the treatment team including social workers and nurses and psychologist. At this point we are going to discharge the patient home. She has follow-up appointment already with [facility]. She has been counseled and educated about the importance of staying in treatment to maintain mood stability and not to go back to abusing substances. Also counseled about the desirability of minimizing marijuana use. Patient understands all this and agrees to the outpatient treatment plan. No change to medicine. No new prescriptions given.   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   SECONDARY DIAGNOSES:  AXIS I: Opiate dependence, in partial remission, rule out cannabis abuse.   AXIS II: Deferred.   AXIS III: Mild asthma.   AXIS IV: Moderate. Chronic stress from chaos in her life.   AXIS V: Functioning at time of admission 60.    ____________________________ Audery Amel, MD jtc:cms D: 03/07/2012 15:15:00 ET T: 03/07/2012 15:35:51 ET  JOB#: [id number] cc: Audery Amel, MD, <Dictator>  Audery Amel MD ELECTRONICALLY SIGNED 03/07/2012 15:55

## 2014-09-07 NOTE — Discharge Summary (Signed)
PATIENT NAME:  Tammy FujitaWOODLIEF, Saja D MR#:  [id number] DATE OF BIRTH:  10-18-1990  DATE OF ADMISSION:  03/06/2012 DATE OF DISCHARGE:  03/07/2012  HOSPITAL COURSE: See the dictated History and Physical for all of information about the admission. This 24 year old woman was petitioned for admission by her grandmother, who alleged suicidal ideation. The grandmother has now called and stated that she lied on the petition and recanted all allegations of suicidal threats. The patient consistently denies any suicidal ideation and denies any acute psychiatric syndrome. Behavior is calm. She appears lucid. There is no indication for further hospitalization. There is concern that she may have ongoing substance abuse problems, but she also was prescribed some controlled substances in the community and does not present as having an acute addiction or abuse problem. She is not having any withdrawal complaints currently. The patient has been calm and appropriate and not behaved in a dangerous manner. She [facility]. She has a safe place to live. At this point, she is being discharged because there is no further need for inpatient hospitalization.   DISPOSITION: Discharge back to live with her boyfriend. [facility]   DISCHARGE MEDICATIONS: She says that she takes Suboxone and Vyvanse and Klonopin or Xanax. I am not going to give her any new prescriptions and no medicines were started. She can follow up with her appropriate outpatient providers.   LABORATORY DATA: As dictated in the History and Physical, the chemistry panel just shows a slightly elevated sodium, chloride and CO2 and slightly low calcium. No particular significance is likely. Alcohol was not detected. CBC was normal. TSH was slightly low at 0.21 and has not been repeated. Drug screen was positive for opiates, cannabis, and benzodiazepines.   MENTAL STATUS EXAMINATION:  Neatly dressed young woman who looks her stated age. Alert and oriented x4. Good eye contact, normal psychomotor activity. Slightly rapid speech, not pressured. Thoughts are lucid. No loosening of associations or flight of ideas. Affect is euthymic, reactive, and appropriate. Mood is stated as fine. No evidence of delusional thinking or paranoia. She denies auditory or visual hallucinations. She denies suicidal or homicidal ideation. Reasonable judgment and insight, normal intelligence.   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   SECONDARY DIAGNOSES:  AXIS I:  1. Opiate dependence, in partial remission.  2. Rule out cannabis abuse.   AXIS II: Deferred.   AXIS III: Mild asthma.   AXIS IV: Moderate. Chronic stress from chaos in her life.   AXIS V: Functioning at time of admission 67.   ____________________________ Audery AmelJohn T. Clapacs, MD jtc:cbb D: 03/07/2012 15:19:03 ET T: 03/08/2012 15:53:56 ET JOB#: [id number]  cc: Audery AmelJohn T. Clapacs, MD, <Dictator> Audery AmelJOHN T CLAPACS MD ELECTRONICALLY SIGNED 03/09/2012 15:10

## 2014-09-11 NOTE — Op Note (Signed)
PATIENT NAME:  Tammy [name], Tammy [name] MR#:  [id number] DATE OF BIRTH:  11/24/1990  DATE OF PROCEDURE:  09/30/2013  PREOPERATIVE DIAGNOSES:  1. Postdates pregnancy at 6541 weeks' gestation.  2. Fetal intolerance to labor.   POSTOPERATIVE DIAGNOSES:  1. Postdates pregnancy at 6641 weeks' gestation.  2. Fetal intolerance to labor.  3. Status closed primary low transverse cesarean section.   PROCEDURE: Primary low transverse cesarean section.   SURGEON: Anika S. Valentino Saxonherry, M.[name].   ASSISTANYolanda Bonine: Melody Burr, CNM.   EBL: 500 mL.   INTRAVENOUS FLUIDS: 1100 mL.   URINE OUTPUT: 200 mL.   SPECIMENS: Placenta.   FINDINGS: Included infant delivered from an ROA position, cephalic presentation female with Apgars of 8 at 1 minute and 9 at 5 minutes. Normal-appearing placenta. Clear amniotic fluid was noted. The uterine outline, tubes and ovaries appeared normal.   COMPLICATIONS: None.   PROCEDURE: The patient was taken to the operating room where she was placed under epidural anesthesia which was found to be adequate from prior labor course. A timeout was then performed, and the procedure was verified as C-section delivery. The patient received 900 mg of clindamycin prior to the procedure. Next, the patient was draped and prepped in normal sterile fashion in the dorsal supine position with leftward tilt. The epidural was tested and found to be adequate. A Pfannenstiel incision was then made and carried down through the subcutaneous tissue to the fascia with the Bovie. The fascia was then incised in the midline, and the incision was extended laterally using the Mayo scissors. The superior aspect of the fascial incision was then grasped with the Kocher clamps, elevated and the underlying rectus muscles were dissected off using the Bovie. There was an area of bleeding noted on the left rectus muscle that was controlled by a suture of 0 Vicryl in a figure-of-eight fashion. Next, attention was then turned inferiorly  where in a similar fashion the fascia was grasped, tented up with Kocher clamps and the rectus muscles dissected off bluntly. The rectus muscles were then separated in the midline bluntly. The peritoneum was identified, tented up and entered sharply with Metzenbaum scissors. The peritoneal incision was then extended superiorly and inferiorly with good visualization of the bladder. The uterovesical peritoneal reflection was incised transversely, and a bladder flap was then bluntly freed from the lower uterine segment. The bladder blade was then inserted. A low transverse uterine incision was made along the lower uterine segment with the scalpel. The incision was then extended laterally and superiorly bluntly. The bladder blade was then removed, and the infant's head was delivered atraumatically. No nuchal cord was noted. The nose and mouth were suctioned, and the infant was handed off to the pediatricians present at delivery. The infant was delivered from vertex presentation. It was a 3180 gram female with Apgars of 8 and 8 at 1 and 5 minutes, respectively. After the umbilical cord was clamped and cut, the placenta was then removed manually and intact and appeared normal. The uterus was exteriorized and cleared of all clots and debris. The uterine outline, tubes and ovaries appeared normal. The uterine incision was then closed with running locked sutures of 0 Vicryl. A second layer of imbrication was performed using a running suture of 0 Vicryl. A small area of bleeding was still noted after the second layer, so a figure-of-eight suture was placed on the left lateral edge of the uterine incision. After this, hemostasis was observed. A moist laparotomy sponge was then used to clear  out the gutters of all clots and debris. Next, the fascia was reapproximated using a running suture of 0 Vicryl. The skin was reapproximated using 4-0 Monocryl. A Lidoderm patch was placed above and below the incision site, and the incision  was sealed using Dermabond.  The patient was then taken to the recovery room in stable condition.  Instrument, sponge and needle counts were correct prior to the abdominal closure and at the conclusion of the case.   DISPOSITION: The patient was taken to the PACU hemodynamically stable.   ____________________________ Jacques Earthly. Valentino Saxon, MD asc:gb [name]: 09/30/2013 23:21:55 ET T: 09/30/2013 23:55:47 ET JOB#: [id number]  cc: Jacques Earthly. Valentino Saxon, MD, <Dictator> Fabian November MD ELECTRONICALLY SIGNED 10/05/2013 15:33

## 2014-09-28 NOTE — H&P (Signed)
L&D Evaluation:  History:  HPI 24yo SWF presnts for IOL at 7361w6d EGA, G2P0010, normal PNC to date, with NST and u/s all normal. +HSV with daily prophyylaxis  3 weeks, no recent outbreaks.   Patient's Medical History HSV   Patient's Surgical History D&C   Medications Pre Serbiaatal Vitamins  Tylenol (Acetaminophen)  Acyclovir   Allergies PCN   Social History none   Family History Non-Contributory   ROS:  ROS All systems were reviewed.  HEENT, CNS, GI, GU, Respiratory, CV, Renal and Musculoskeletal systems were found to be normal.   Exam:  Vital Signs stable   General no apparent distress   Mental Status clear   Chest clear   Heart normal sinus rhythm   Abdomen gravid, non-tender   Estimated Fetal Weight Average for gestational age   Fetal Position vtx   Edema no edema   Pelvic 1/40/-2   Mebranes Intact   FHT normal rate with no decels   Fetal Heart Rate 145   Ucx irregular   Ucx Frequency 8 min   Length of each Contraction 30 seconds   Ucx Pain Scale 0   Impression:  Impression Postterm IOL   Plan:  Plan monitor contractions and for cervical change, Cervidil ripening and pitocin IOL in am   Electronic Signatures: Yolanda BonineBurr, Melody N (CNM)  (Signed 12-May-15 21:07)  Authored: L&D Evaluation   Last Updated: 12-May-15 21:07 by Ulyses AmorBurr, Melody N (CNM)

## 2014-11-24 ENCOUNTER — Encounter: Payer: Self-pay | Admitting: *Deleted

## 2014-11-25 ENCOUNTER — Encounter: Payer: Self-pay | Admitting: Obstetrics and Gynecology

## 2014-11-25 ENCOUNTER — Ambulatory Visit (INDEPENDENT_AMBULATORY_CARE_PROVIDER_SITE_OTHER): Payer: Medicaid Other | Admitting: Obstetrics and Gynecology

## 2014-11-25 VITALS — BP 128/84 | HR 88 | Ht 62.0 in | Wt 159.9 lb

## 2014-11-25 DIAGNOSIS — E669 Obesity, unspecified: Secondary | ICD-10-CM

## 2014-11-25 DIAGNOSIS — R1013 Epigastric pain: Secondary | ICD-10-CM

## 2014-11-25 DIAGNOSIS — R197 Diarrhea, unspecified: Secondary | ICD-10-CM | POA: Diagnosis not present

## 2014-11-25 NOTE — Progress Notes (Signed)
Subjective:     Patient ID: Tammy [name], female   DOB: January 25, 1991, 24 y.o.   MRN: [id number]  HPI Reports upper abdominal pains for a few months, sharp that sometimes radiate to back between shoulder blades, and daily diarrhea  Review of Systems See HPI- denies nausea or vomiting, worse after eating, multiple loose or liquid stools daily. Denies fever, dysuria, or constipation.    Objective:   Physical Exam A&O x3 Drowsy and sleeping when I entered the room Abdomen guarding at epigastric area with negative rebound tenderness, but equistly tender on palpation Abdomen otherwise soft without masses    Assessment:     Epigastric pain with diarrhea     Plan:     HIDA scan with upper abd. U/s scheduled for tomorrow Labs obtained  Will f/u according to results  Tammy [name]

## 2014-11-25 NOTE — Patient Instructions (Signed)
Abdominal Pain  Many things can cause abdominal pain. Usually, abdominal pain is not caused by a disease and will improve without treatment. It can often be observed and treated at home. Your health care provider will do a physical exam and possibly order blood tests and X-rays to help determine the seriousness of your pain. However, in many cases, more time must pass before a clear cause of the pain can be found. Before that point, your health care provider may not know if you need more testing or further treatment.  HOME CARE INSTRUCTIONS   Monitor your abdominal pain for any changes. The following actions may help to alleviate any discomfort you are experiencing:   Only take over-the-counter or prescription medicines as directed by your health care provider.   Do not take laxatives unless directed to do so by your health care provider.   Try a clear liquid diet (broth, tea, or water) as directed by your health care provider. Slowly move to a bland diet as tolerated.  SEEK MEDICAL CARE IF:   You have unexplained abdominal pain.   You have abdominal pain associated with nausea or diarrhea.   You have pain when you urinate or have a bowel movement.   You experience abdominal pain that wakes you in the night.   You have abdominal pain that is worsened or improved by eating food.   You have abdominal pain that is worsened with eating fatty foods.   You have a fever.  SEEK IMMEDIATE MEDICAL CARE IF:    Your pain does not go away within 2 hours.   You keep throwing up (vomiting).   Your pain is felt only in portions of the abdomen, such as the right side or the left lower portion of the abdomen.   You pass bloody or black tarry stools.  MAKE SURE YOU:   Understand these instructions.    Will watch your condition.    Will get help right away if you are not doing well or get worse.   Document Released: 02/14/2005 Document Revised: 05/12/2013 Document Reviewed: 01/14/2013  ExitCare Patient Information  2015 [employer]. This information is not intended to replace advice given to you by your health care provider. Make sure you discuss any questions you have with your health care provider.    Gallbladder Nuclear Scanning  This is a test in which a radioactive substance is injected into the blood. The material injected is not harmful to you. This material then concentrates in the bile. Within approximately 1 hour, your gallbladder and the ducts leading to and from it and into the first part of the small bowel, can be visualized by an x-ray scan.   PREPARATION FOR TEST  No preparation or fasting is necessary.  NORMAL FINDINGS  Gallbladder, common bile duct, and duodenum can be visualized within 60 minutes after radionuclide injection. (This confirms the cystic and common bile ducts which lead from the liver and gallbladder are open and allowing bile to pass.)  Ranges for normal findings may vary among different laboratories and hospitals. You should always check with your doctor after having lab work or other tests done to discuss the meaning of your test results and whether your values are considered within normal limits.  MEANING OF TEST   Your caregiver will go over the test results with you and discuss the importance and meaning of your results, as well as treatment options and the need for additional tests if necessary.  OBTAINING THE TEST   04/17/2008 ExitCare Patient Information 2015 [place]. This information is not intended to replace advice given to you by your health care provider. Make sure you discuss any questions you have with your health care provider.

## 2014-11-26 ENCOUNTER — Ambulatory Visit
Admission: RE | Admit: 2014-11-26 | Discharge: 2014-11-26 | Disposition: A | Discharge status: Home / Self Care | Payer: Medicaid Other | Source: Ambulatory Visit | Attending: Obstetrics and Gynecology | Admitting: Obstetrics and Gynecology

## 2014-11-26 ENCOUNTER — Encounter: Admission: RE | Admit: 2014-11-26 | Payer: Medicaid Other | Source: Ambulatory Visit

## 2014-11-26 ENCOUNTER — Other Ambulatory Visit: Payer: Self-pay | Admitting: Obstetrics and Gynecology

## 2014-11-26 DIAGNOSIS — A048 Other specified bacterial intestinal infections: Secondary | ICD-10-CM

## 2014-11-26 DIAGNOSIS — R1013 Epigastric pain: Secondary | ICD-10-CM | POA: Insufficient documentation

## 2014-11-26 LAB — HEPATIC FUNCTION PANEL
ALT: 9 IU/L (ref 0–32)
AST: 14 IU/L (ref 0–40)
Albumin: 4.3 g/dL (ref 3.5–5.5)
Alkaline Phosphatase: 88 IU/L (ref 39–117)
Bilirubin Total: 0.5 mg/dL (ref 0.0–1.2)
Bilirubin, Direct: 0.15 mg/dL (ref 0.00–0.40)
Total Protein: 6.4 g/dL (ref 6.0–8.5)

## 2014-11-26 LAB — AMYLASE: Amylase: 39 U/L (ref 31–124)

## 2014-11-26 LAB — BILE ACIDS, TOTAL: Bile Acids Total: 4.3 umol/L — ABNORMAL LOW (ref 4.7–24.5)

## 2014-11-26 LAB — LIPASE: Lipase: 18 U/L (ref 0–59)

## 2014-11-26 LAB — H. PYLORI ANTIBODY, IGG: H Pylori IgG: 3.3 U/mL — ABNORMAL HIGH (ref 0.0–0.8)

## 2014-11-26 MED ORDER — CLARITHROMYCIN 500 MG PO TABS: 500.0000 mg | ORAL_TABLET | Freq: Two times a day (BID) | ORAL | Status: DC

## 2014-11-26 MED ORDER — METRONIDAZOLE 500 MG PO TABS: 500.0000 mg | ORAL_TABLET | Freq: Two times a day (BID) | ORAL | Status: DC

## 2014-11-26 MED ORDER — PANTOPRAZOLE SODIUM 40 MG PO TBEC: 40.0000 mg | DELAYED_RELEASE_TABLET | Freq: Every day | ORAL | Status: DC

## 2014-11-30 LAB — H. PYLORI ANTIBODY, IGA: H. pylori, IgA Abs: 11.1 units — ABNORMAL HIGH (ref 0.0–8.9)

## 2014-12-02 ENCOUNTER — Telehealth: Payer: Self-pay | Admitting: Obstetrics and Gynecology

## 2014-12-02 NOTE — Telephone Encounter (Signed)
Please let her know we can put her on call list for a cancelation spot if one comes open before then, otherwise will need to wait till then  To apply cold cloths to area and take tylenol or motrin, though I highly doubt Nexplanon is causing her symptoms as long as it has been in.

## 2014-12-02 NOTE — Telephone Encounter (Signed)
Tammy [name] called at lunch and said she wanted her nexplanon taken out, her arm is swollen and hurts.. Your schedule next week is horrible and I told her the first available was Friday 7/29 and she had a fit. She wants to be worked in so I told her i would ask you.

## 2014-12-03 ENCOUNTER — Telehealth: Payer: Self-pay | Admitting: Obstetrics and Gynecology

## 2014-12-03 NOTE — Telephone Encounter (Signed)
Pt was told that Amy was going to call her, pt called on Friday and had not heard from our office, I spoke with Zella BallRobin and Zella BallRobin stated taht Amy was going to call her on Thursday and explaned to her that she would need to make an appt for the next available time that [facility] had that the truble she was having with the nexplanon was not a emergency. I told pt that amy was not here today that I would get there to call her back on Monday and she wanted to know what she was suppose to do, I explained to her that [facility] stated she would need to make an appt at the next available time and that the issue she was having with wanting her nexplanon removed was not an emergency pt state she wanted to go to the health dept.

## 2014-12-07 NOTE — Telephone Encounter (Signed)
Tried calling pt several times na Pt has appt 12/17/2014 will check schedule daily if cancellation will attempt to call pt again

## 2014-12-08 ENCOUNTER — Telehealth: Payer: Self-pay | Admitting: Obstetrics and Gynecology

## 2014-12-08 NOTE — Telephone Encounter (Signed)
PT CALLED AND WANTED YOU TO CALL HER BACK, SHE DID NOT STATE WHY SHE WAS CALLING

## 2014-12-14 NOTE — Telephone Encounter (Signed)
i have tried calling pt several times, left several messages no rtn call back

## 2014-12-16 ENCOUNTER — Other Ambulatory Visit: Payer: Self-pay | Admitting: *Deleted

## 2014-12-16 MED ORDER — ACYCLOVIR 400 MG PO TABS: 400.0000 mg | ORAL_TABLET | Freq: Two times a day (BID) | ORAL | Status: DC

## 2014-12-22 ENCOUNTER — Encounter: Payer: Self-pay | Admitting: Obstetrics and Gynecology

## 2014-12-22 ENCOUNTER — Ambulatory Visit (INDEPENDENT_AMBULATORY_CARE_PROVIDER_SITE_OTHER): Payer: Medicaid Other | Admitting: Obstetrics and Gynecology

## 2014-12-22 VITALS — BP 118/78 | HR 98 | Ht 62.0 in | Wt 160.6 lb

## 2014-12-22 DIAGNOSIS — R300 Dysuria: Secondary | ICD-10-CM

## 2014-12-22 DIAGNOSIS — B9681 Helicobacter pylori [H. pylori] as the cause of diseases classified elsewhere: Secondary | ICD-10-CM | POA: Diagnosis not present

## 2014-12-22 DIAGNOSIS — A048 Other specified bacterial intestinal infections: Secondary | ICD-10-CM

## 2014-12-22 LAB — POCT URINALYSIS DIPSTICK
Glucose, UA: NEGATIVE
Ketones, UA: NEGATIVE
Leukocytes, UA: NEGATIVE
Nitrite, UA: NEGATIVE
Protein, UA: 5
Spec Grav, UA: 1.02
Urobilinogen, UA: 0.2
pH, UA: 5

## 2014-12-22 MED ORDER — NORGESTIM-ETH ESTRAD TRIPHASIC 0.18/0.215/0.25 MG-35 MCG PO TABS: 1.0000 | ORAL_TABLET | Freq: Every day | ORAL | Status: DC

## 2014-12-22 NOTE — Patient Instructions (Signed)
Sexually Transmitted Disease A sexually transmitted disease (STD) is a disease or infection often passed to another person during sex. However, STDs can be passed through nonsexual ways. An STD can be passed through:  Spit (saliva).  Semen.  Blood.  Mucus from the vagina.  Pee (urine). HOW CAN I LESSEN MY CHANCES OF GETTING AN STD?  Use:  Latex condoms.  Water-soluble lubricants with condoms. Do not use petroleum jelly or oils.  Dental dams. These are small pieces of latex that are used as a barrier during oral sex.  Avoid having more than one sex partner.  Do not have sex with someone who has other sex partners.  Do not have sex with anyone you do not know or who is at high risk for an STD.  Avoid risky sex that can break your skin.  Do not have sex if you have open sores on your mouth or skin.  Avoid drinking too much alcohol or taking illegal drugs. Alcohol and drugs can affect your good judgment.  Avoid oral and anal sex acts.  Get shots (vaccines) for HPV and hepatitis.  If you are at risk of being infected with HIV, it is advised that you take a certain medicine daily to prevent HIV infection. This is called pre-exposure prophylaxis (PrEP). You may be at risk if:  You are a man who has sex with other men (MSM).  You are attracted to the opposite sex (heterosexual) and are having sex with more than one partner.  You take drugs with a needle.  You have sex with someone who has HIV.  Talk with your doctor about if you are at high risk of being infected with HIV. If you begin to take PrEP, get tested for HIV first. Get tested every 3 months for as long as you are taking PrEP. WHAT SHOULD I DO IF I THINK I HAVE AN STD?  See your doctor.  Tell your sex partner(s) that you have an STD. They should be tested and treated.  Do not have sex until your doctor says it is okay. WHEN SHOULD I GET HELP? Get help right away if:  You have bad belly (abdominal)  pain.  You are a man and have puffiness (swelling) or pain in your testicles.  You are a woman and have puffiness in your vagina. Document Released: 06/14/2004 Document Revised: 05/12/2013 Document Reviewed: 10/31/2012 ExitCare Patient Information 2015 [employer]. This information is not intended to replace advice given to you by your health care provider. Make sure you discuss any questions you have with your health care provider.  

## 2014-12-22 NOTE — Progress Notes (Signed)
Patient ID: Tammy [name], female   DOB: [birth year], 24 y.o.   MRN: [id number]  desires nexplanon removal and switch to OCPs; also desires STD screen as found out previous partner unfaithful..  Lastly reports persistent lose stools, and is still taking H Pylori medications.  O: A&Ox4 Pelvic exam: normal external genitalia, vulva, vagina, cervix, uterus and adnexa. Microscopic wet-mount exam shows negative for pathogens, normal epithelial cells, KOH done, DNA probe for chlamydia and GC obtained.  Tammy [name] is a 24 y.o. year old Caucasian female here for Implanon removal.  Patient given informed consent for removal of her Implanon.  BP 118/78 mmHg  Pulse 98  Ht  (1.575 m)  Wt 160 lb 9.6 oz (72.848 kg)  BMI 29.37 kg/m2  LMP 12/16/2014  Breastfeeding? No  Appropriate time out taken. Implanon site identified.  Area prepped in usual sterile fashon. One cc of 2% lidocaine was used to anesthetize the area at the distal end of the implant. A small stab incision was made right beside the implant on the distal portion.  The Implanon rod was grasped using hemostats and removed without difficulty.  There was less than 3 cc blood loss. There were no complications.  Steri-strips were applied over the small incision and a pressure bandage was applied.  The patient tolerated the procedure well.  A: nexplanon removal STD screening H Pylori diarrhea  P:  She was instructed to keep the area clean and dry, remove pressure bandage in 24 hours, and keep insertion site covered with the steri-strip for 3-5 days.   Switch to Ortho cyclen- rx sent in Labs obtained for STD screen  Referral to GI  Follow-up PRN problems.  Melody N Burr,CNM

## 2014-12-23 LAB — RPR: RPR Ser Ql: NONREACTIVE

## 2014-12-23 LAB — HELICOBACTER PYLORI  ANTIBODY, IGM: H. pylori, IgM Abs: <9 units (ref 0.0–8.9)

## 2014-12-23 LAB — HIV ANTIBODY (ROUTINE TESTING W REFLEX): HIV Screen 4th Generation wRfx: NONREACTIVE

## 2014-12-23 LAB — H. PYLORI ANTIBODY, IGG: H Pylori IgG: 2.5 U/mL — ABNORMAL HIGH (ref 0.0–0.8)

## 2014-12-23 LAB — HEPATITIS C ANTIBODY: Hep C Virus Ab: <0.1 s/co ratio (ref 0.0–0.9)

## 2014-12-26 LAB — NUSWAB VAGINITIS PLUS (VG+)

## 2014-12-28 LAB — HSV 1 AND 2 IGM ABS, INDIRECT
HSV 1 IgM: <1:10 {titer}
HSV 2 IgM: <1:10 {titer}

## 2015-01-03 ENCOUNTER — Telehealth: Payer: Self-pay | Admitting: *Deleted

## 2015-01-03 NOTE — Telephone Encounter (Signed)
-----   Message from Ulyses Amor, [place] sent at 12/28/2014  3:58 PM EDT ----- Please let her know labs so far are negative for STD exposure- still showing some H Pylori but better, to continue medications and definitely go to GI appt

## 2015-01-03 NOTE — Telephone Encounter (Signed)
Left detailed message for pt about lab results and to keep her appt with GI

## 2015-02-18 ENCOUNTER — Telehealth: Payer: Self-pay | Admitting: Obstetrics and Gynecology

## 2015-02-18 NOTE — Telephone Encounter (Signed)
PT NEED RX REFILL ON ZANEX AND CERAQUIL.

## 2015-02-21 ENCOUNTER — Other Ambulatory Visit: Payer: Self-pay | Admitting: Obstetrics and Gynecology

## 2015-02-21 NOTE — Telephone Encounter (Signed)
pls advise

## 2015-02-22 ENCOUNTER — Other Ambulatory Visit: Payer: Self-pay | Admitting: Obstetrics and Gynecology

## 2015-02-22 MED ORDER — QUETIAPINE FUMARATE 25 MG PO TABS: 25.0000 mg | ORAL_TABLET | Freq: Every day | ORAL | Status: DC

## 2015-02-22 NOTE — Telephone Encounter (Signed)
Please let her know when these are sent in. Refills on both done today

## 2015-02-22 NOTE — Telephone Encounter (Signed)
Notified pt. 

## 2015-04-07 ENCOUNTER — Ambulatory Visit: Payer: Medicaid Other | Admitting: Obstetrics and Gynecology

## 2015-04-28 ENCOUNTER — Ambulatory Visit: Payer: Medicaid Other | Admitting: Obstetrics and Gynecology

## 2015-04-29 ENCOUNTER — Ambulatory Visit (INDEPENDENT_AMBULATORY_CARE_PROVIDER_SITE_OTHER): Payer: Medicaid Other | Admitting: Obstetrics and Gynecology

## 2015-04-29 ENCOUNTER — Encounter: Payer: Self-pay | Admitting: Obstetrics and Gynecology

## 2015-04-29 ENCOUNTER — Other Ambulatory Visit: Payer: Self-pay | Admitting: Obstetrics and Gynecology

## 2015-04-29 VITALS — BP 102/69 | HR 79 | Ht 62.0 in | Wt 152.4 lb

## 2015-04-29 DIAGNOSIS — R300 Dysuria: Secondary | ICD-10-CM | POA: Diagnosis not present

## 2015-04-29 LAB — POCT URINALYSIS DIPSTICK
Glucose, UA: NEGATIVE
Ketones, UA: NEGATIVE
Nitrite, UA: NEGATIVE
Spec Grav, UA: 1.015
Urobilinogen, UA: 0.2
pH, UA: 6.5

## 2015-04-29 MED ORDER — ALPRAZOLAM 1 MG PO TABS: 1.0000 mg | ORAL_TABLET | Freq: Two times a day (BID) | ORAL | Status: DC

## 2015-04-29 MED ORDER — NORGESTIM-ETH ESTRAD TRIPHASIC 0.18/0.215/0.25 MG-35 MCG PO TABS: 1.0000 | ORAL_TABLET | Freq: Every day | ORAL | Status: DC

## 2015-04-29 MED ORDER — QUETIAPINE FUMARATE 50 MG PO TABS: 50.0000 mg | ORAL_TABLET | Freq: Every day | ORAL | Status: DC

## 2015-04-29 NOTE — Progress Notes (Signed)
Subjective:     Patient ID: Tammy [name], female   DOB: Oct 03, 1990, 24 y.o.   MRN: [id number]  HPI  Burning with urination x 1 week Wants STD screening just to be sure, on menses.   Review of Systems See above. Also needs refill on medications- is now taking 2 xanax daily and doesn't feel like seroquel is working as well, but also doesn't feel like she needs it every night either.    Objective:   Physical Exam A&O x4  no apparent distress Abdomen soft and nontender Pelvic exam: normal external genitalia, vulva, vagina, cervix, uterus and adnexa, menstrual bleeding noted.    Assessment:     Dysuria STD screen Anxiety Occasional sleep disturbance  medication refills     Plan:     Urine sent for culture and GC/CMZ Xanax refilled and increase Seroquel dose to 50mg  for prn use. RTC in 4 months for Annual or prn.  Melody ElkhornShambley, CNM

## 2015-04-30 LAB — URINE CULTURE

## 2015-05-02 LAB — GC/CHLAMYDIA PROBE AMP
Chlamydia trachomatis, NAA: NEGATIVE
Neisseria gonorrhoeae by PCR: NEGATIVE

## 2015-05-03 ENCOUNTER — Telehealth: Payer: Self-pay | Admitting: *Deleted

## 2015-05-03 NOTE — Telephone Encounter (Signed)
Notified pt of results 

## 2015-05-03 NOTE — Telephone Encounter (Signed)
-----   Message from Purcell NailsMelody N Shambley, [place] sent at 05/03/2015  2:39 PM EST ----- Please let her know STD screen negative
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
## 2015-05-22 NOTE — L&D Delivery Note (Signed)
Delivery Summary for Tammy FujitaJessica D Yeo  Labor Events:   Preterm labor:   Rupture date:   Rupture time:   Rupture type: Intact  Fluid Color: Yellow  Induction:   Augmentation:   Complications:   Cervical ripening:          Delivery:   Episiotomy:   Lacerations:   Repair suture:   Repair # of packets:   Blood loss (ml):    Information for the patient's newborn:  Robin SearingWoodlief, Girl Kailie [[id number]][[id number]]    Delivery 02/16/2016 2:35 PM by  C-Section, Low Transverse Sex:  female Gestational Age: 1064w0d Delivery Clinician:   Living?:         APGARS  One minute Five minutes Ten minutes  Skin color:        Heart rate:        Grimace:        Muscle tone:        Breathing:        Totals: 8  9      Presentation/position:      Resuscitation:   Cord information:    Disposition of cord blood:     Blood gases sent?  Complications:   Placenta: Delivered:       appearance Newborn Measurements: Weight: 6 lb 12.6 oz (3080 g)  Height: 19.49"  Head circumference:    Chest circumference:    Other providers:    Additional  information: Forceps:   Vacuum:   Breech:   Observed anomalies        See Dr. Oretha Milchherry's C-section operative note for details of delivery.    Hildred LaserAnika Cherry, MD [facility]

## 2015-07-22 ENCOUNTER — Ambulatory Visit: Payer: Medicaid Other | Admitting: Obstetrics and Gynecology

## 2015-07-22 VITALS — BP 111/77 | HR 60 | Wt 147.0 lb

## 2015-07-22 DIAGNOSIS — Z369 Encounter for antenatal screening, unspecified: Secondary | ICD-10-CM

## 2015-07-22 DIAGNOSIS — Z3687 Encounter for antenatal screening for uncertain dates: Secondary | ICD-10-CM

## 2015-07-22 DIAGNOSIS — Z3490 Encounter for supervision of normal pregnancy, unspecified, unspecified trimester: Secondary | ICD-10-CM

## 2015-07-22 DIAGNOSIS — Z1389 Encounter for screening for other disorder: Secondary | ICD-10-CM

## 2015-07-22 DIAGNOSIS — Z113 Encounter for screening for infections with a predominantly sexual mode of transmission: Secondary | ICD-10-CM

## 2015-07-22 NOTE — Progress Notes (Signed)
  Tammy FujitaJessica D [name] presents for NOB nurse interview visit. G-3.  P-1011. Pregnancy education material explained and given. No cats in the home. NOB labs ordered. HIV labs and Drug screen were explained optional and she could opt out of tests but did not decline. Drug screen ordered. PNV encouraged. NT to discuss with provider. Pt. To follow up with provider after dating and viability ultrasound done. LMP pt is unsure. Sometime in December which makes pt 12 wks if this is correct.  All questions answered. Pt states Dr. Lacie ScottsNiemeyer gave her an ATB for UTI and stopped taking it because it made her sick. Will send urine off for ua and uc. Also pt c/o spotting a little. Informed pt this is not uncommon and if increasing to like a period and severe abd pain, call office or go to ER.    ZIKA EXPOSURE SCREEN:  The patient has not traveled to a [place] endemic area within the past 6 months, nor has she had unprotected sex with a partner who has travelled to a [place] endemic region within the past 6 months. The patient has been advised to notify us if these factors change any time during this current pregnancy, so adequate testing and monitoring can be initiated.

## 2015-07-23 LAB — PAIN MGT SCRN (14 DRUGS), UR
Amphetamine Screen, Ur: NEGATIVE ng/mL
Barbiturate Screen, Ur: NEGATIVE ng/mL
Benzodiazepine Screen, Urine: NEGATIVE ng/mL
Buprenorphine, Urine: POSITIVE ng/mL
Cannabinoids Ur Ql Scn: POSITIVE ng/mL
Cocaine(Metab.)Screen, Urine: NEGATIVE ng/mL
Creatinine(Crt), U: 242.8 mg/dL (ref 20.0–300.0)
Fentanyl, Urine: NEGATIVE pg/mL
Meperidine Screen, Urine: NEGATIVE ng/mL
Methadone Scn, Ur: NEGATIVE ng/mL
Opiate Scrn, Ur: NEGATIVE ng/mL
Oxycodone+Oxymorphone Ur Ql Scn: NEGATIVE ng/mL
PCP Scrn, Ur: NEGATIVE ng/mL
Ph of Urine: 5.7 (ref 4.5–8.9)
Propoxyphene, Screen: NEGATIVE ng/mL
Tramadol Ur Ql Scn: NEGATIVE ng/mL

## 2015-07-23 LAB — NICOTINE SCREEN, URINE: Cotinine Ql Scrn, Ur: NEGATIVE ng/mL

## 2015-07-23 LAB — URINALYSIS, ROUTINE W REFLEX MICROSCOPIC
Bilirubin, UA: NEGATIVE
Ketones, UA: NEGATIVE
Leukocytes, UA: NEGATIVE
Nitrite, UA: NEGATIVE
RBC, UA: NEGATIVE
Specific Gravity, UA: 1.024 (ref 1.005–1.030)
Urobilinogen, Ur: 0.2 mg/dL (ref 0.2–1.0)
pH, UA: 6.5 (ref 5.0–7.5)

## 2015-07-24 LAB — CULTURE, OB URINE

## 2015-07-24 LAB — URINE CULTURE, OB REFLEX

## 2015-07-25 LAB — GC/CHLAMYDIA PROBE AMP
Chlamydia trachomatis, NAA: NEGATIVE
Neisseria gonorrhoeae by PCR: NEGATIVE

## 2015-07-26 ENCOUNTER — Other Ambulatory Visit: Payer: Self-pay | Admitting: Obstetrics and Gynecology

## 2015-07-26 DIAGNOSIS — O9989 Other specified diseases and conditions complicating pregnancy, childbirth and the puerperium: Principal | ICD-10-CM

## 2015-07-26 DIAGNOSIS — F1290 Cannabis use, unspecified, uncomplicated: Secondary | ICD-10-CM | POA: Insufficient documentation

## 2015-07-26 DIAGNOSIS — Z283 Underimmunization status: Secondary | ICD-10-CM | POA: Insufficient documentation

## 2015-07-26 DIAGNOSIS — Z2839 Other underimmunization status: Secondary | ICD-10-CM | POA: Insufficient documentation

## 2015-07-26 DIAGNOSIS — O09899 Supervision of other high risk pregnancies, unspecified trimester: Secondary | ICD-10-CM | POA: Insufficient documentation

## 2015-07-26 LAB — CBC WITH DIFFERENTIAL/PLATELET
Basophils Absolute: 0 10*3/uL (ref 0.0–0.2)
Basos: 0 %
EOS (ABSOLUTE): 0.2 10*3/uL (ref 0.0–0.4)
Eos: 3 %
Hematocrit: 38.2 % (ref 34.0–46.6)
Hemoglobin: 13.5 g/dL (ref 11.1–15.9)
Immature Grans (Abs): 0 10*3/uL (ref 0.0–0.1)
Immature Granulocytes: 0 %
Lymphocytes Absolute: 1.6 10*3/uL (ref 0.7–3.1)
Lymphs: 22 %
MCH: 30.4 pg (ref 26.6–33.0)
MCHC: 35.3 g/dL (ref 31.5–35.7)
MCV: 86 fL (ref 79–97)
Monocytes Absolute: 0.4 10*3/uL (ref 0.1–0.9)
Monocytes: 5 %
Neutrophils Absolute: 5 10*3/uL (ref 1.4–7.0)
Neutrophils: 70 %
Platelets: 269 10*3/uL (ref 150–379)
RBC: 4.44 x10E6/uL (ref 3.77–5.28)
RDW: 13.4 % (ref 12.3–15.4)
WBC: 7.2 10*3/uL (ref 3.4–10.8)

## 2015-07-26 LAB — RUBELLA ANTIBODY, IGM: Rubella IgM: <20 AU/mL (ref 0.0–19.9)

## 2015-07-26 LAB — HEPATITIS B SURFACE ANTIGEN: Hepatitis B Surface Ag: NEGATIVE

## 2015-07-26 LAB — RH TYPE: Rh Factor: POSITIVE

## 2015-07-26 LAB — RPR: RPR Ser Ql: NONREACTIVE

## 2015-07-26 LAB — HIV ANTIBODY (ROUTINE TESTING W REFLEX): HIV Screen 4th Generation wRfx: NONREACTIVE

## 2015-07-26 LAB — VARICELLA ZOSTER ANTIBODY, IGM: Varicella IgM: 0.92 index — ABNORMAL HIGH (ref 0.00–0.90)

## 2015-07-26 LAB — ANTIBODY SCREEN: Antibody Screen: NEGATIVE

## 2015-07-26 LAB — ABO

## 2015-07-27 ENCOUNTER — Ambulatory Visit (INDEPENDENT_AMBULATORY_CARE_PROVIDER_SITE_OTHER): Payer: Medicaid Other

## 2015-07-27 DIAGNOSIS — Z36 Encounter for antenatal screening of mother: Secondary | ICD-10-CM

## 2015-07-27 DIAGNOSIS — Z3687 Encounter for antenatal screening for uncertain dates: Secondary | ICD-10-CM

## 2015-08-24 ENCOUNTER — Encounter: Payer: Self-pay | Admitting: Obstetrics and Gynecology

## 2015-08-24 ENCOUNTER — Encounter: Payer: Medicaid Other | Admitting: Obstetrics and Gynecology

## 2015-08-25 ENCOUNTER — Ambulatory Visit (INDEPENDENT_AMBULATORY_CARE_PROVIDER_SITE_OTHER): Payer: Medicaid Other | Admitting: Obstetrics and Gynecology

## 2015-08-25 ENCOUNTER — Encounter: Payer: Self-pay | Admitting: Obstetrics and Gynecology

## 2015-08-25 VITALS — BP 92/67 | HR 79 | Wt 146.8 lb

## 2015-08-25 DIAGNOSIS — Z331 Pregnant state, incidental: Secondary | ICD-10-CM

## 2015-08-25 LAB — POCT URINALYSIS DIPSTICK
Bilirubin, UA: NEGATIVE
Blood, UA: NEGATIVE
Glucose, UA: NEGATIVE
Ketones, UA: NEGATIVE
Leukocytes, UA: NEGATIVE
Nitrite, UA: NEGATIVE
Protein, UA: NEGATIVE
Spec Grav, UA: 1.01
Urobilinogen, UA: 0.2
pH, UA: 6.5

## 2015-08-25 NOTE — Progress Notes (Signed)
OB work in- pt is c/o discharge, she is also hurting in her abdomen

## 2015-08-25 NOTE — Patient Instructions (Signed)

## 2015-08-25 NOTE — Progress Notes (Signed)
NEW OB HISTORY AND PHYSICAL  SUBJECTIVE:       Tammy [name] is a 25 y.o. G64P0011 female, Patient's last menstrual period was 05/19/2015., Estimated Date of Delivery: 02/23/16, [redacted]w[redacted]d, presents today for establishment of Prenatal Care. She has no unusual complaints and complains of nausea and increased discharge      Gynecologic History Patient's last menstrual period was 05/19/2015. Unknown Contraception: none Last Pap: 2016. Results were: normal  Obstetric History OB History  Gravida Para Term Preterm AB SAB TAB Ectopic Multiple Living  # Outcome Date GA Lbr Len/2nd Weight Sex Delivery Anes PTL Lv  3 Current           2 Gravida 09/30/13    F CS-Unspec   Y  1 SAB 2014              Past Medical History  Diagnosis Date  . Herpes genitalia   . Asthma   . Anxiety   . Depression   . ADHD (attention deficit hyperactivity disorder)   . History of chlamydia     Past Surgical History  Procedure Laterality Date  . Peg tube placement Bilateral 2011  . Tonsillectomy  2011    Current Outpatient Prescriptions on File Prior to Visit  Medication Sig Dispense Refill  . Prenatal Vit-Fe Fumarate-FA (PRENATAL MULTIVITAMIN) TABS tablet Take 1 tablet by mouth daily at 12 noon. Reported on 08/25/2015    . acyclovir (ZOVIRAX) 400 MG tablet Take 1 tablet (400 mg total) by mouth 2 (two) times daily. (Patient not taking: Reported on 07/22/2015) 60 tablet 3   No current facility-administered medications on file prior to visit.    Allergies  Allergen Reactions  . Hydrocodone-Acetaminophen Itching  . Morphine And Related Itching  . Peanuts [Peanut Oil] Swelling  . Penicillins Hives    Social History   Social History  . Marital Status: Single    Spouse Name: N/A  . Number of Children: N/A  . Years of Education: N/A   Occupational History  . Not on file.   Social History Main Topics  . Smoking status: Never Smoker   . Smokeless tobacco: Not on file  . Alcohol  Use: No  . Drug Use: No  . Sexual Activity: Yes   Other Topics Concern  . Not on file   Social History Narrative    Family History  Problem Relation Age of Onset  . Cancer Maternal Grandmother     thyroid    The following portions of the patient's history were reviewed and updated as appropriate: allergies, current medications, past OB history, past medical history, past surgical history, past family history, past social history, and problem list.    OBJECTIVE: Initial Physical Exam (New OB)  GENERAL APPEARANCE: alert, well appearing, in no apparent distress, oriented to person, place and time HEAD: normocephalic, atraumatic MOUTH: mucous membranes moist, pharynx normal without lesions and dental hygiene poor THYROID: no thyromegaly or masses present BREASTS: not examined LUNGS: clear to auscultation, no wheezes, rales or rhonchi, symmetric air entry HEART: no murmurs ABDOMEN: soft, nontender, nondistended, no abnormal masses, no epigastric pain, fundus soft, nontender 15 weeks size and FHT present EXTREMITIES: no redness or tenderness in the calves or thighs SKIN: normal coloration and turgor, no rashes LYMPH NODES: no adenopathy palpable NEUROLOGIC: alert, oriented, normal speech, no focal findings or movement disorder noted  PELVIC EXAM EXTERNAL GENITALIA: normal appearing vulva with no masses,  tenderness or lesions  ASSESSMENT: Normal pregnancy  PLAN: Prenatal care See orders

## 2015-09-01 ENCOUNTER — Encounter: Payer: Medicaid Other | Admitting: Obstetrics and Gynecology

## 2015-09-23 ENCOUNTER — Other Ambulatory Visit: Payer: Self-pay | Admitting: Obstetrics and Gynecology

## 2015-09-23 DIAGNOSIS — Z3492 Encounter for supervision of normal pregnancy, unspecified, second trimester: Secondary | ICD-10-CM

## 2015-09-29 ENCOUNTER — Encounter: Payer: Self-pay | Admitting: Obstetrics and Gynecology

## 2015-09-29 ENCOUNTER — Ambulatory Visit (INDEPENDENT_AMBULATORY_CARE_PROVIDER_SITE_OTHER): Payer: Medicaid Other | Admitting: Obstetrics and Gynecology

## 2015-09-29 ENCOUNTER — Ambulatory Visit (INDEPENDENT_AMBULATORY_CARE_PROVIDER_SITE_OTHER): Payer: Medicaid Other

## 2015-09-29 VITALS — BP 104/79 | HR 89 | Wt 150.6 lb

## 2015-09-29 DIAGNOSIS — Z3492 Encounter for supervision of normal pregnancy, unspecified, second trimester: Secondary | ICD-10-CM | POA: Diagnosis not present

## 2015-09-29 DIAGNOSIS — Z331 Pregnant state, incidental: Secondary | ICD-10-CM

## 2015-09-29 MED ORDER — LORATADINE-PSEUDOEPHEDRINE ER 5-120 MG PO TB12: 1.0000 | ORAL_TABLET | Freq: Two times a day (BID) | ORAL | Status: DC

## 2015-09-29 NOTE — Progress Notes (Signed)
ROB-pt had anatomy scan done today-pt denies any sx

## 2015-09-29 NOTE — Progress Notes (Signed)
Indications:Anatomy U/S Findings:  Singleton intrauterine pregnancy is visualized with FHR at 152 BPM. Biometrics give an (U/S) Gestational age of 19 2/7 weeks and an (U/S) EDD of 02/21/16; this correlates with the clinically established EDD of 02/23/16.  Fetal presentation is Vertex.  EFW: 289g (10oz). Placenta: fundal, grade 0, 5.3 cm from internal os. MVP: 3.4cm.  Anatomic survey is incomplete and normal; Gender - female  .  Spine suboptimal due to fetal position.  Ovaries are not visualized. Survey of the adnexa demonstrates no adnexal masses. There is no free peritoneal fluid in the cul de sac.  Impression: 1. 19 2/7 week Viable Singleton Intrauterine pregnancy by U/S. 2. (U/S) EDD is consistent with Clinically established (LMP) EDD of 02/23/16. 3. Normal Anatomy Scan  ROB- reports increased nasal congestion- OK to take claritin D as needed, will return in 2 weeks to complete anatomy scan

## 2015-10-10 ENCOUNTER — Other Ambulatory Visit: Payer: Self-pay | Admitting: Obstetrics and Gynecology

## 2015-10-10 DIAGNOSIS — Z0489 Encounter for examination and observation for other specified reasons: Secondary | ICD-10-CM

## 2015-10-10 DIAGNOSIS — IMO0002 Reserved for concepts with insufficient information to code with codable children: Secondary | ICD-10-CM

## 2015-10-13 ENCOUNTER — Ambulatory Visit (INDEPENDENT_AMBULATORY_CARE_PROVIDER_SITE_OTHER): Payer: Medicaid Other

## 2015-10-13 DIAGNOSIS — Z36 Encounter for antenatal screening of mother: Secondary | ICD-10-CM | POA: Diagnosis not present

## 2015-10-13 DIAGNOSIS — IMO0002 Reserved for concepts with insufficient information to code with codable children: Secondary | ICD-10-CM

## 2015-10-13 DIAGNOSIS — Z0489 Encounter for examination and observation for other specified reasons: Secondary | ICD-10-CM

## 2015-10-27 ENCOUNTER — Encounter: Payer: Medicaid Other | Admitting: Obstetrics and Gynecology

## 2015-11-02 ENCOUNTER — Telehealth: Payer: Self-pay | Admitting: Obstetrics and Gynecology

## 2015-11-02 NOTE — Telephone Encounter (Signed)
Pt states she has a spider bite on the back of her leg. It is red and swollen. NO red streaks noted. NO itching.  Only burning and painful. She is at her child's peds office and the ped dr advised her to be seen. Pt aware mns not in office. Advised her to be seen at urgent care. As I was trying to tell pt to put ice on bite and take tylenol, she hung up on me. Pt did not have phone completely hung up and I heard her say "she was getting tried on these damn doctors. "

## 2015-11-02 NOTE — Telephone Encounter (Signed)
Pt said she thinks spider bit her on the back of the leg. It's swollen and red.

## 2015-11-16 ENCOUNTER — Encounter: Payer: Self-pay | Admitting: Obstetrics and Gynecology

## 2015-11-16 ENCOUNTER — Ambulatory Visit (INDEPENDENT_AMBULATORY_CARE_PROVIDER_SITE_OTHER): Payer: Medicaid Other | Admitting: Obstetrics and Gynecology

## 2015-11-16 VITALS — BP 116/78 | HR 92 | Wt 162.0 lb

## 2015-11-16 DIAGNOSIS — Z3493 Encounter for supervision of normal pregnancy, unspecified, third trimester: Secondary | ICD-10-CM

## 2015-11-16 LAB — POCT URINALYSIS DIPSTICK
Bilirubin, UA: NEGATIVE
Blood, UA: NEGATIVE
Glucose, UA: NEGATIVE
Ketones, UA: NEGATIVE
Nitrite, UA: NEGATIVE
Spec Grav, UA: 1.015
Urobilinogen, UA: 0.2
pH, UA: 6.5

## 2015-11-16 MED ORDER — RANITIDINE HCL 150 MG PO TABS: 150.0000 mg | ORAL_TABLET | Freq: Two times a day (BID) | ORAL | Status: DC

## 2015-11-16 NOTE — Progress Notes (Signed)
ROB- pt is doing well denies any complaints 

## 2015-11-16 NOTE — Progress Notes (Signed)
ROB-c/o heartburn after certain foods, TUMS helps.zantac . Glucola next visit;

## 2015-12-08 ENCOUNTER — Ambulatory Visit (INDEPENDENT_AMBULATORY_CARE_PROVIDER_SITE_OTHER): Payer: Medicaid Other | Admitting: Obstetrics and Gynecology

## 2015-12-08 ENCOUNTER — Other Ambulatory Visit: Payer: Medicaid Other

## 2015-12-08 ENCOUNTER — Encounter: Payer: Medicaid Other | Admitting: Obstetrics and Gynecology

## 2015-12-08 VITALS — BP 97/53 | HR 71 | Wt 165.8 lb

## 2015-12-08 DIAGNOSIS — Z23 Encounter for immunization: Secondary | ICD-10-CM

## 2015-12-08 DIAGNOSIS — Z3483 Encounter for supervision of other normal pregnancy, third trimester: Secondary | ICD-10-CM

## 2015-12-08 DIAGNOSIS — Z3493 Encounter for supervision of normal pregnancy, unspecified, third trimester: Secondary | ICD-10-CM

## 2015-12-08 DIAGNOSIS — O34219 Maternal care for unspecified type scar from previous cesarean delivery: Secondary | ICD-10-CM

## 2015-12-08 DIAGNOSIS — Z131 Encounter for screening for diabetes mellitus: Secondary | ICD-10-CM

## 2015-12-08 LAB — POCT URINALYSIS DIPSTICK
Bilirubin, UA: NEGATIVE
Blood, UA: NEGATIVE
Glucose, UA: NEGATIVE
Ketones, UA: NEGATIVE
Nitrite, UA: NEGATIVE
Protein, UA: NEGATIVE
Spec Grav, UA: 1.015
Urobilinogen, UA: 2
pH, UA: 6.5

## 2015-12-08 MED ORDER — TETANUS-DIPHTH-ACELL PERTUSSIS 5-2.5-18.5 LF-MCG/0.5 IM SUSP
0.5000 mL | Freq: Once | INTRAMUSCULAR | Status: AC
  Administered 2015-12-08: 0.5 mL via INTRAMUSCULAR

## 2015-12-08 NOTE — Progress Notes (Signed)
ROB: Patient referred from [facility] for consultation for h/o prior C-section, desiring repeat.  Discussion had with patient, who now notes that she is thinking about having baby "naturally" if possible.  Discussed risks/benefits of TOLAC vs repeat C-section. Patient still desires trial of labor. For 28 week labs today, signed blood consent. For Tdap today.  Discussed cord blood banking. RTC in 2 weeks.

## 2015-12-09 DIAGNOSIS — O34219 Maternal care for unspecified type scar from previous cesarean delivery: Secondary | ICD-10-CM | POA: Insufficient documentation

## 2015-12-09 LAB — HEMOGLOBIN AND HEMATOCRIT, BLOOD
Hematocrit: 35.7 % (ref 34.0–46.6)
Hemoglobin: 12.2 g/dL (ref 11.1–15.9)

## 2015-12-09 LAB — GLUCOSE TOLERANCE, 1 HOUR: Glucose, 1Hr PP: 110 mg/dL (ref 65–199)

## 2015-12-13 ENCOUNTER — Telehealth: Payer: Self-pay | Admitting: *Deleted

## 2015-12-13 NOTE — Telephone Encounter (Signed)
-----   Message from Debbe Bales, [place] sent at 12/12/2015  3:40 PM EDT -----   ----- Message ----- From: Hildred Laser, MD Sent: 12/10/2015   9:43 PM To: Debbe Bales, CMA  Please inform of normal 1 hr glucola.

## 2015-12-19 ENCOUNTER — Ambulatory Visit (INDEPENDENT_AMBULATORY_CARE_PROVIDER_SITE_OTHER): Payer: Medicaid Other | Admitting: Obstetrics and Gynecology

## 2015-12-19 VITALS — BP 105/63 | HR 75 | Wt 163.1 lb

## 2015-12-19 DIAGNOSIS — Z3493 Encounter for supervision of normal pregnancy, unspecified, third trimester: Secondary | ICD-10-CM

## 2015-12-19 LAB — POCT URINALYSIS DIPSTICK
Bilirubin, UA: NEGATIVE
Blood, UA: NEGATIVE
Glucose, UA: NEGATIVE
Ketones, UA: NEGATIVE
Leukocytes, UA: NEGATIVE
Nitrite, UA: NEGATIVE
Protein, UA: NEGATIVE
Spec Grav, UA: 1.01
Urobilinogen, UA: 0.2
pH, UA: 7

## 2015-12-19 NOTE — Progress Notes (Signed)
ROB-doing well, discussed BC PP and infant feeding;discussed TOLAC vs C/S again- desires TOLAC until due date, and repeat c/s if not delivered by then.

## 2015-12-19 NOTE — Progress Notes (Signed)
ROB- pt is c/o frequent urination, otherwise she is doing well

## 2015-12-30 ENCOUNTER — Observation Stay
Admission: EM | Admit: 2015-12-30 | Discharge: 2015-12-30 | Disposition: A | Discharge status: Home / Self Care | Payer: Medicaid Other | Attending: Obstetrics and Gynecology | Admitting: Obstetrics and Gynecology

## 2015-12-30 DIAGNOSIS — Z3A35 35 weeks gestation of pregnancy: Secondary | ICD-10-CM | POA: Diagnosis not present

## 2015-12-30 DIAGNOSIS — F1290 Cannabis use, unspecified, uncomplicated: Secondary | ICD-10-CM

## 2015-12-30 DIAGNOSIS — O09899 Supervision of other high risk pregnancies, unspecified trimester: Secondary | ICD-10-CM

## 2015-12-30 DIAGNOSIS — R319 Hematuria, unspecified: Secondary | ICD-10-CM | POA: Diagnosis not present

## 2015-12-30 DIAGNOSIS — M545 Low back pain: Secondary | ICD-10-CM | POA: Diagnosis not present

## 2015-12-30 DIAGNOSIS — O26893 Other specified pregnancy related conditions, third trimester: Principal | ICD-10-CM | POA: Insufficient documentation

## 2015-12-30 DIAGNOSIS — Z283 Underimmunization status: Secondary | ICD-10-CM

## 2015-12-30 DIAGNOSIS — Z2839 Other underimmunization status: Secondary | ICD-10-CM

## 2015-12-30 DIAGNOSIS — O9989 Other specified diseases and conditions complicating pregnancy, childbirth and the puerperium: Secondary | ICD-10-CM

## 2015-12-30 LAB — URINALYSIS COMPLETE WITH MICROSCOPIC (ARMC ONLY)
Bacteria, UA: NONE SEEN
Bilirubin Urine: NEGATIVE
Glucose, UA: NEGATIVE mg/dL
Hgb urine dipstick: NEGATIVE
Ketones, ur: NEGATIVE mg/dL
Leukocytes, UA: NEGATIVE
Nitrite: NEGATIVE
Protein, ur: 30 mg/dL — AB
Specific Gravity, Urine: 1.026 (ref 1.005–1.030)
pH: 6 (ref 5.0–8.0)

## 2015-12-30 MED ORDER — NITROFURANTOIN MONOHYD MACRO 100 MG PO CAPS
ORAL_CAPSULE | ORAL | Status: AC
  Administered 2015-12-30: 100 mg via ORAL
  Filled 2015-12-30: qty 1

## 2015-12-30 MED ORDER — NITROFURANTOIN MONOHYD MACRO 100 MG PO CAPS
100.0000 mg | ORAL_CAPSULE | Freq: Two times a day (BID) | ORAL | Status: DC
  Administered 2015-12-30: 100 mg via ORAL

## 2015-12-30 NOTE — Discharge Instructions (Signed)
Nitrofurantoin tablets or capsules What is this medicine? NITROFURANTOIN (nye troe fyoor AN toyn) is an antibiotic. It is used to treat urinary tract infections. This medicine may be used for other purposes; ask your health care provider or pharmacist if you have questions. What should I tell my health care provider before I take this medicine? They need to know if you have any of these conditions: -anemia -diabetes -glucose-6-phosphate dehydrogenase deficiency -kidney disease -liver disease -lung disease -other chronic illness -an unusual or allergic reaction to nitrofurantoin, other antibiotics, other medicines, foods, dyes or preservatives -pregnant or trying to get pregnant -breast-feeding How should I use this medicine? Take this medicine by mouth with a glass of water. Follow the directions on the prescription label. Take this medicine with food or milk. Take your doses at regular intervals. Do not take your medicine more often than directed. Do not stop taking except on your doctor's advice. Talk to your pediatrician regarding the use of this medicine in children. While this drug may be prescribed for selected conditions, precautions do apply. Overdosage: If you think you have taken too much of this medicine contact a poison control center or emergency room at once. NOTE: This medicine is only for you. Do not share this medicine with others. What if I miss a dose? If you miss a dose, take it as soon as you can. If it is almost time for your next dose, take only that dose. Do not take double or extra doses. What may interact with this medicine? -antacids containing magnesium trisilicate -probenecid -quinolone antibiotics like ciprofloxacin, lomefloxacin, norfloxacin and ofloxacin -sulfinpyrazone This list may not describe all possible interactions. Give your health care provider a list of all the medicines, herbs, non-prescription drugs, or dietary supplements you use. Also tell  them if you smoke, drink alcohol, or use illegal drugs. Some items may interact with your medicine. What should I watch for while using this medicine? Tell your doctor or health care professional if your symptoms do not improve or if you get new symptoms. Drink several glasses of water a day. If you are taking this medicine for a long time, visit your doctor for regular checks on your progress. If you are diabetic, you may get a false positive result for sugar in your urine with certain brands of urine tests. Check with your doctor. What side effects may I notice from receiving this medicine? Side effects that you should report to your doctor or health care professional as soon as possible: -allergic reactions like skin rash or hives, swelling of the face, lips, or tongue -chest pain -cough -difficulty breathing -dizziness, drowsiness -fever or infection -joint aches or pains -pale or blue-tinted skin -redness, blistering, peeling or loosening of the skin, including inside the mouth -tingling, burning, pain, or numbness in hands or feet -unusual bleeding or bruising -unusually weak or tired -yellowing of eyes or skin Side effects that usually do not require medical attention (report to your doctor or health care professional if they continue or are bothersome): -dark urine -diarrhea -headache -loss of appetite -nausea or vomiting -temporary hair loss This list may not describe all possible side effects. Call your doctor for medical advice about side effects. You may report side effects to FDA at 1-800-FDA-1088. Where should I keep my medicine? Keep out of the reach of children. Store at room temperature between 15 and 30 degrees C (59 and 86 degrees F). Protect from light. Throw away any unused medicine after the expiration date. NOTE:  This sheet is a summary. It may not cover all possible information. If you have questions about this medicine, talk to your doctor, pharmacist, or health  care provider.    2016, Elsevier/Gold Standard. (2007-11-26 15:56:47) Asymptomatic Bacteriuria, Female Asymptomatic bacteriuria is the presence of a large number of bacteria in your urine without the usual symptoms of burning or frequent urination. The following conditions increase the risk of asymptomatic bacteriuria:  Diabetes mellitus.  Advanced age.  Pregnancy in the first trimester.  Kidney stones.  Kidney transplants.  Leaky kidney tube valve in young children (reflux). Treatment for this condition is not needed in most people and can lead to other problems such as too much yeast and growth of resistant bacteria. However, some people, such as pregnant women, do need treatment to prevent kidney infection. Asymptomatic bacteriuria in pregnancy is also associated with fetal growth restriction, premature labor, and newborn death. HOME CARE INSTRUCTIONS Monitor your condition for any changes. The following actions may help to relieve any discomfort you are feeling:  Drink enough water and fluids to keep your urine clear or pale yellow. Go to the bathroom more often to keep your bladder empty.  Keep the area around your vagina and rectum clean. Wipe yourself from front to back after urinating. SEEK IMMEDIATE MEDICAL CARE IF:  You develop signs of an infection such as:  Burning with urination.  Frequency of voiding.  Back pain.  Fever.  You have blood in the urine.  You develop a fever. MAKE SURE YOU:  Understand these instructions.  Will watch your condition.  Will get help right away if you are not doing well or get worse.   This information is not intended to replace advice given to you by your health care provider. Make sure you discuss any questions you have with your health care provider.   Document Released: 05/07/2005 Document Revised: 05/28/2014 Document Reviewed: 10/27/2012 Elsevier Interactive Patient Education 2016 Elsevier Inc. LABOR: When contractions  begin, you should start to time them from the beginning of one contraction to the beginning of the next.  When contractions are 5-10 minutes apart or less and have been regular for at least an hour, you should call your health care provider.  Notify your doctor if any of the following occur: 1. Bleeding from the vagina 7. Sudden, constant, or occasional abdominal pain  2. Pain or burning when urinating 8. Sudden gushing of fluid from the vagina (with or without continued leaking)  3. Chills or fever 9. Fainting spells, "black outs" or loss of consciousness  4. Increase in vaginal discharge 10. Severe or continued nausea or vomiting  5. Pelvic pressure (sudden increase) 11. Blurring of vision or spots before the eyes  6. Baby moving less than usual 12. Leaking of fluid    FETAL KICK COUNT: Lie on your left side for one hour after a meal, and count the number of times your baby kicks. If it is less than 5 times, get up, move around and drink some juice. Repeat the test 30 minutes later. If it is still less than 5 kicks in an hour, notify your doctor.

## 2015-12-31 DIAGNOSIS — O26893 Other specified pregnancy related conditions, third trimester: Secondary | ICD-10-CM | POA: Diagnosis not present

## 2015-12-31 NOTE — Progress Notes (Signed)
Pt arrived to unit at 2035 complaining of lower back pain, painful urination x2 days, and blood in urine starting this afternoon. NST reactive, UA sent, Urine culture ordered  and one dose of 100mg  of macrobid given [facility]. Pt discharged in stable condition home with family. Pt to call for follow up appointment with CNM on 01/02/15. Pt verbalized understanding.

## 2016-01-01 LAB — URINE CULTURE: Culture: 6000 — AB

## 2016-01-04 ENCOUNTER — Telehealth: Payer: Self-pay | Admitting: Obstetrics and Gynecology

## 2016-01-04 NOTE — Telephone Encounter (Signed)
Tammy [name] CALLED [facility] PAN[facility]. SHE SAID THE OTHER CLINIC WANTS HER TO TAKE SEVOXIN  AND SHE IS ALLERGIC TO IT. IT MAKES HER FACE SWELL. SHE WANTED TO TALK TO YOU.

## 2016-01-05 ENCOUNTER — Encounter: Payer: Medicaid Other | Admitting: Obstetrics and Gynecology

## 2016-01-09 ENCOUNTER — Ambulatory Visit (INDEPENDENT_AMBULATORY_CARE_PROVIDER_SITE_OTHER): Payer: Medicaid Other | Admitting: Obstetrics and Gynecology

## 2016-01-09 VITALS — BP 108/61 | HR 80 | Wt 166.0 lb

## 2016-01-09 DIAGNOSIS — Z3493 Encounter for supervision of normal pregnancy, unspecified, third trimester: Secondary | ICD-10-CM

## 2016-01-09 LAB — POCT URINALYSIS DIPSTICK
Bilirubin, UA: NEGATIVE
Blood, UA: NEGATIVE
Glucose, UA: NEGATIVE
Ketones, UA: NEGATIVE
Leukocytes, UA: NEGATIVE
Nitrite, UA: NEGATIVE
Protein, UA: NEGATIVE
Spec Grav, UA: 1.015
Urobilinogen, UA: 0.2
pH, UA: 6.5

## 2016-01-09 NOTE — Progress Notes (Signed)
ROB- pt is having contractions, doesn't feel baby moving as much

## 2016-01-09 NOTE — Progress Notes (Signed)
ROB- doing well, fell down stairs 4 days ago, decreased fetal mov't since, reassured. Microscopic wet-mount exam shows negative for pathogens, normal epithelial cells.

## 2016-01-09 NOTE — Telephone Encounter (Signed)
Note done and gave to pt

## 2016-01-20 NOTE — Discharge Summary (Signed)
L&D OB Triage Note  Tammy FujitaJessica D Dorwart is a 25 y.o. [id number] female at 2762w1d, EDD Estimated Date of Delivery: 02/23/16 who presented to triage for complaints of contractions with low back pain and blood in urine this pm. Denies decreased fetal movement or leaking fluid.  She was evaluated by the nurses with no significant findings/findings significant for labor. Vital signs stable. An NST was performed and has been reviewed by me. She was treated with fluids and one dose macrobid while waiting for urine culture results.   NST INTERPRETATION: Indications: rule out uterine contractions  Mode: External Baseline Rate (A): 141 bpm Variability: Moderate Accelerations: 15 x 15 Decelerations: None     Contraction Frequency (min): occasional irritability  Impression: reactive Hematuria with probable UTI   Plan: NST performed was reviewed and was found to be reactive. She was discharged home with bleeding/labor precautions. Will call with culture results. Continue routine prenatal care. Follow up with OB/GYN as previously scheduled.     Melody Suzan NailerN Shambley, CNM

## 2016-01-25 ENCOUNTER — Encounter: Payer: Medicaid Other | Admitting: Obstetrics and Gynecology

## 2016-02-02 ENCOUNTER — Encounter: Payer: Self-pay | Admitting: Emergency Medicine

## 2016-02-02 ENCOUNTER — Encounter: Payer: Medicaid Other | Admitting: Obstetrics and Gynecology

## 2016-02-02 ENCOUNTER — Emergency Department
Admission: EM | Admit: 2016-02-02 | Discharge: 2016-02-02 | Disposition: A | Discharge status: Home / Self Care | Payer: No Typology Code available for payment source | Attending: Emergency Medicine | Admitting: Emergency Medicine

## 2016-02-02 ENCOUNTER — Observation Stay
Admission: RE | Admit: 2016-02-02 | Discharge: 2016-02-02 | Disposition: A | Discharge status: Home / Self Care | Payer: Medicaid Other | Source: Ambulatory Visit | Attending: Obstetrics & Gynecology | Admitting: Obstetrics & Gynecology

## 2016-02-02 DIAGNOSIS — O9A212 Injury, poisoning and certain other consequences of external causes complicating pregnancy, second trimester: Secondary | ICD-10-CM | POA: Diagnosis not present

## 2016-02-02 DIAGNOSIS — Y92410 Unspecified street and highway as the place of occurrence of the external cause: Secondary | ICD-10-CM | POA: Insufficient documentation

## 2016-02-02 DIAGNOSIS — Z79899 Other long term (current) drug therapy: Secondary | ICD-10-CM | POA: Diagnosis not present

## 2016-02-02 DIAGNOSIS — Z3A32 32 weeks gestation of pregnancy: Secondary | ICD-10-CM | POA: Insufficient documentation

## 2016-02-02 DIAGNOSIS — J45909 Unspecified asthma, uncomplicated: Secondary | ICD-10-CM | POA: Insufficient documentation

## 2016-02-02 DIAGNOSIS — S30811A Abrasion of abdominal wall, initial encounter: Secondary | ICD-10-CM | POA: Diagnosis not present

## 2016-02-02 DIAGNOSIS — Y999 Unspecified external cause status: Secondary | ICD-10-CM | POA: Diagnosis not present

## 2016-02-02 DIAGNOSIS — F909 Attention-deficit hyperactivity disorder, unspecified type: Secondary | ICD-10-CM | POA: Insufficient documentation

## 2016-02-02 DIAGNOSIS — M549 Dorsalgia, unspecified: Secondary | ICD-10-CM | POA: Insufficient documentation

## 2016-02-02 DIAGNOSIS — Z041 Encounter for examination and observation following transport accident: Secondary | ICD-10-CM | POA: Diagnosis present

## 2016-02-02 DIAGNOSIS — O26893 Other specified pregnancy related conditions, third trimester: Secondary | ICD-10-CM | POA: Insufficient documentation

## 2016-02-02 DIAGNOSIS — Z3A37 37 weeks gestation of pregnancy: Secondary | ICD-10-CM | POA: Diagnosis not present

## 2016-02-02 DIAGNOSIS — Y9389 Activity, other specified: Secondary | ICD-10-CM | POA: Diagnosis not present

## 2016-02-02 DIAGNOSIS — R1030 Lower abdominal pain, unspecified: Secondary | ICD-10-CM | POA: Insufficient documentation

## 2016-02-02 HISTORY — DX: Other psychoactive substance abuse, uncomplicated: F19.10

## 2016-02-02 LAB — URINALYSIS COMPLETE WITH MICROSCOPIC (ARMC ONLY)
Bilirubin Urine: NEGATIVE
Glucose, UA: NEGATIVE mg/dL
Hgb urine dipstick: NEGATIVE
Ketones, ur: NEGATIVE mg/dL
Leukocytes, UA: NEGATIVE
Nitrite: NEGATIVE
Protein, ur: NEGATIVE mg/dL
Specific Gravity, Urine: 1.012 (ref 1.005–1.030)
pH: 7 (ref 5.0–8.0)

## 2016-02-02 MED ORDER — ACETAMINOPHEN 500 MG PO TABS
500.0000 mg | ORAL_TABLET | Freq: Once | ORAL | Status: AC
  Administered 2016-02-02: 500 mg via ORAL

## 2016-02-02 MED ORDER — ACETAMINOPHEN 500 MG PO TABS
ORAL_TABLET | ORAL | Status: AC
  Administered 2016-02-02: 500 mg via ORAL
  Filled 2016-02-02: qty 1

## 2016-02-02 NOTE — Discharge Instructions (Signed)
Please proceed directly to labor and delivery for evaluation.

## 2016-02-02 NOTE — Progress Notes (Signed)
Pt off monitor and dressed, coming to nurses station to says she is leaving for home. Asked pt to wait until provided was called and notified of lab result and request to go home now. Pt agrees to wait while CNM was called.  2132 - M. Shambley CNM called and order received for discharge with f/u instructions. Discharge instructions given to pt with labor precautions and handouts. Pt agrees to return to hospital with any s/s of labor or worsening abd or back pain, decrease fetal movement, vaginal bleeding or leaking fluid. Pt reminded to call in the morning to reschedule her OB appt that was missed today. Pt agrees then seen leaving unit in no apparent distress for home.
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
## 2016-02-02 NOTE — ED Triage Notes (Signed)
Pt reports she has not felt the baby move in awhile.

## 2016-02-02 NOTE — Progress Notes (Signed)
Plan discussed with pt, explained po pain med and continued monitoring x2 hours, pt states she has taken Tylenol in the past and it does not work for the pain but she will take it. Pt with family in room, says the baby is okay and she wants to go home, does not want to stay for 2 hours, mentions she will come back if she start hurting again, says she is not hurting now.

## 2016-02-02 NOTE — Discharge Instructions (Signed)
UA results normal, reviewed with pt. FHT remains reactive, isolated variable decel noted earlier, reactive tracing now with irregular contractions noted. No cervical exam performed. Discharge teaching reviewed with pt, s/o and family present. Encouraged pt to drink plenty fluids throughout the day and night to stay well hydrated. Avoid strenuous activity, rest, warm shower, may take Tylenol for mild soreness and pelvic rest until seen and evaluated at next OB visit. Advised pt to call office in the morning and rescheduled today's missed appointment for next week. Last Tylenol 500mg  given at 08:30p
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
## 2016-02-04 LAB — URINE CULTURE

## 2016-02-07 ENCOUNTER — Telehealth: Payer: Self-pay | Admitting: Obstetrics and Gynecology

## 2016-02-07 NOTE — Telephone Encounter (Signed)
[name],   See previous messages.  Patient desires to be scheduled for repeat C-section for 02/16/16.  I will put in her pre-op orders. Please schedule.     Hildred LaserAnika Cherry, MD [facility]

## 2016-02-07 NOTE — Telephone Encounter (Signed)
Shanda BumpsJessica called and said she needed an appt with Dr. Valentino Saxonherry to schedule C-Section. She wants her c-section on 9/28

## 2016-02-07 NOTE — Telephone Encounter (Signed)
Hey Dr. Margo Commonherry oh Tammy [name] would like to have her c-section this date, doesn't want her family to know anything about it

## 2016-02-10 ENCOUNTER — Ambulatory Visit (INDEPENDENT_AMBULATORY_CARE_PROVIDER_SITE_OTHER): Payer: Medicaid Other | Admitting: Obstetrics and Gynecology

## 2016-02-10 VITALS — BP 118/78 | HR 94 | Wt 168.6 lb

## 2016-02-10 DIAGNOSIS — Z3685 Encounter for antenatal screening for Streptococcus B: Secondary | ICD-10-CM

## 2016-02-10 DIAGNOSIS — Z3493 Encounter for supervision of normal pregnancy, unspecified, third trimester: Secondary | ICD-10-CM

## 2016-02-10 DIAGNOSIS — Z36 Encounter for antenatal screening of mother: Secondary | ICD-10-CM

## 2016-02-10 DIAGNOSIS — Z113 Encounter for screening for infections with a predominantly sexual mode of transmission: Secondary | ICD-10-CM

## 2016-02-10 LAB — POCT URINALYSIS DIPSTICK
Blood, UA: NEGATIVE
Glucose, UA: NEGATIVE
Ketones, UA: NEGATIVE
Leukocytes, UA: NEGATIVE
Nitrite, UA: NEGATIVE
Protein, UA: NEGATIVE
Spec Grav, UA: 1.015
Urobilinogen, UA: 0.2
pH, UA: 7

## 2016-02-10 NOTE — Progress Notes (Signed)
ROB- cultures obtained, pt is having some pelvic pressure 

## 2016-02-10 NOTE — Progress Notes (Signed)
ROB- cultures obtained, still on the fence about TOLAC- but will proceed with pre-op visit next week.

## 2016-02-15 ENCOUNTER — Encounter
Admission: RE | Admit: 2016-02-15 | Discharge: 2016-02-15 | Disposition: A | Discharge status: Home / Self Care | Payer: Medicaid Other | Source: Ambulatory Visit | Attending: Obstetrics and Gynecology | Admitting: Obstetrics and Gynecology

## 2016-02-15 ENCOUNTER — Other Ambulatory Visit: Payer: Self-pay | Admitting: Obstetrics and Gynecology

## 2016-02-15 ENCOUNTER — Other Ambulatory Visit: Payer: Medicaid Other

## 2016-02-15 DIAGNOSIS — Z01812 Encounter for preprocedural laboratory examination: Secondary | ICD-10-CM | POA: Diagnosis not present

## 2016-02-15 LAB — CBC
HCT: 35.5 % (ref 35.0–47.0)
Hemoglobin: 12.8 g/dL (ref 12.0–16.0)
MCH: 31.7 pg (ref 26.0–34.0)
MCHC: 36 g/dL (ref 32.0–36.0)
MCV: 87.9 fL (ref 80.0–100.0)
Platelets: 163 10*3/uL (ref 150–440)
RBC: 4.04 MIL/uL (ref 3.80–5.20)
RDW: 13.2 % (ref 11.5–14.5)
WBC: 10.6 10*3/uL (ref 3.6–11.0)

## 2016-02-15 LAB — TYPE AND SCREEN
ABO/RH(D): A POS
Antibody Screen: NEGATIVE
Extend sample reason: UNDETERMINED

## 2016-02-15 LAB — GC/CHLAMYDIA PROBE AMP
Chlamydia trachomatis, NAA: NEGATIVE
Neisseria gonorrhoeae by PCR: NEGATIVE

## 2016-02-15 NOTE — Patient Instructions (Signed)
  Your procedure is scheduled on: 02/16/16 Thurs at 11:15am Report to Same Day Surgery 3rd floor labor and delivery medical mall   Remember: Instructions that are not followed completely may result in serious medical risk, up to and including death, or upon the discretion of your surgeon and anesthesiologist your surgery may need to be rescheduled.    _x___ 1. Do not eat food or drink liquids after midnight. No gum chewing or hard candies.     __x__ 2. No Alcohol for 24 hours before or after surgery.   __x__3. No Smoking for 24 prior to surgery.   ____  4. Bring all medications with you on the day of surgery if instructed.    __x__ 5. Notify your doctor if there is any change in your medical condition     (cold, fever, infections).     Do not wear jewelry, make-up, hairpins, clips or nail polish.  Do not wear lotions, powders, or perfumes. You may wear deodorant.  Do not shave 48 hours prior to surgery. Men may shave face and neck.  Do not bring valuables to the hospital.    [facility] is not responsible for any belongings or valuables.               Contacts, dentures or bridgework may not be worn into surgery.  Leave your suitcase in the car. After surgery it may be brought to your room.  For patients admitted to the hospital, discharge time is determined by your treatment team.   Patients discharged the day of surgery will not be allowed to drive home.    Please read over the following fact sheets that you were given:   [facility] Preparing for Surgery and or MRSA Information   _x___ Take these medicines the morning of surgery with A SIP OF WATER:    1. none  2.  3.  4.  5.  6.  ____Fleets enema or Magnesium Citrate as directed.   _x___ Use CHG Soap or sage wipes as directed on instruction sheet   ____ Use inhalers on the day of surgery and bring to hospital day of surgery  ____ Stop metformin 2 days prior to surgery    ____ Take 1/2 of usual insulin dose the  night before surgery and none on the morning of           surgery.   ____ Stop aspirin or coumadin, or plavix  x__ Stop Anti-inflammatories such as Advil, Aleve, Ibuprofen, Motrin, Naproxen,          Naprosyn, Goodies powders or aspirin products. Ok to take Tylenol.   ____ Stop supplements until after surgery.    ____ Bring C-Pap to the hospital.

## 2016-02-16 ENCOUNTER — Inpatient Hospital Stay: Payer: Medicaid Other | Admitting: Anesthesiology

## 2016-02-16 ENCOUNTER — Encounter
Admission: RE | Disposition: A | Discharge status: Home / Self Care | Payer: Self-pay | Source: Ambulatory Visit | Attending: Obstetrics and Gynecology

## 2016-02-16 ENCOUNTER — Inpatient Hospital Stay
Admission: RE | Admit: 2016-02-16 | Discharge: 2016-02-19 | DRG: 765 | Disposition: A | Discharge status: Home / Self Care | Payer: Medicaid Other | Source: Ambulatory Visit | Attending: Obstetrics and Gynecology | Admitting: Obstetrics and Gynecology

## 2016-02-16 DIAGNOSIS — Z23 Encounter for immunization: Secondary | ICD-10-CM | POA: Diagnosis not present

## 2016-02-16 DIAGNOSIS — O99324 Drug use complicating childbirth: Secondary | ICD-10-CM | POA: Diagnosis present

## 2016-02-16 DIAGNOSIS — O34211 Maternal care for low transverse scar from previous cesarean delivery: Secondary | ICD-10-CM | POA: Diagnosis present

## 2016-02-16 DIAGNOSIS — O99824 Streptococcus B carrier state complicating childbirth: Secondary | ICD-10-CM | POA: Diagnosis present

## 2016-02-16 DIAGNOSIS — Z3483 Encounter for supervision of other normal pregnancy, third trimester: Secondary | ICD-10-CM | POA: Diagnosis not present

## 2016-02-16 DIAGNOSIS — Z3A39 39 weeks gestation of pregnancy: Secondary | ICD-10-CM | POA: Diagnosis not present

## 2016-02-16 DIAGNOSIS — Z283 Underimmunization status: Secondary | ICD-10-CM

## 2016-02-16 DIAGNOSIS — F1290 Cannabis use, unspecified, uncomplicated: Secondary | ICD-10-CM | POA: Diagnosis present

## 2016-02-16 DIAGNOSIS — Z2839 Other underimmunization status: Secondary | ICD-10-CM

## 2016-02-16 DIAGNOSIS — O09899 Supervision of other high risk pregnancies, unspecified trimester: Secondary | ICD-10-CM

## 2016-02-16 DIAGNOSIS — Z98891 History of uterine scar from previous surgery: Secondary | ICD-10-CM

## 2016-02-16 DIAGNOSIS — O9989 Other specified diseases and conditions complicating pregnancy, childbirth and the puerperium: Secondary | ICD-10-CM

## 2016-02-16 LAB — URINALYSIS COMPLETE WITH MICROSCOPIC (ARMC ONLY)
Bacteria, UA: NONE SEEN
Bilirubin Urine: NEGATIVE
Glucose, UA: NEGATIVE mg/dL
Hgb urine dipstick: NEGATIVE
Leukocytes, UA: NEGATIVE
Nitrite: NEGATIVE
Protein, ur: 30 mg/dL — AB
Specific Gravity, Urine: 1.014 (ref 1.005–1.030)
pH: 8 (ref 5.0–8.0)

## 2016-02-16 LAB — URINE DRUG SCREEN, QUALITATIVE (ARMC ONLY)
Amphetamines, Ur Screen: NOT DETECTED
Barbiturates, Ur Screen: NOT DETECTED
Benzodiazepine, Ur Scrn: POSITIVE — AB
Cannabinoid 50 Ng, Ur ~~LOC~~: NOT DETECTED
Cocaine Metabolite,Ur ~~LOC~~: NOT DETECTED
MDMA (Ecstasy)Ur Screen: NOT DETECTED
Methadone Scn, Ur: NOT DETECTED
Opiate, Ur Screen: NOT DETECTED
Phencyclidine (PCP) Ur S: NOT DETECTED
Tricyclic, Ur Screen: NOT DETECTED

## 2016-02-16 LAB — HIV ANTIBODY (ROUTINE TESTING W REFLEX): HIV Screen 4th Generation wRfx: NONREACTIVE

## 2016-02-16 LAB — RPR: RPR Ser Ql: NONREACTIVE

## 2016-02-16 LAB — ABO/RH: ABO/RH(D): A POS

## 2016-02-16 SURGERY — Surgical Case
Anesthesia: Spinal | Site: Abdomen | Wound class: Clean

## 2016-02-16 MED ORDER — OXYTOCIN 40 UNITS IN LACTATED RINGERS INFUSION - SIMPLE MED
2.5000 [IU]/h | INTRAVENOUS | Status: AC
  Filled 2016-02-16: qty 1000

## 2016-02-16 MED ORDER — MAGNESIUM HYDROXIDE 400 MG/5ML PO SUSP
30.0000 mL | ORAL | Status: DC | PRN
  Filled 2016-02-16: qty 30

## 2016-02-16 MED ORDER — ONDANSETRON HCL 4 MG/2ML IJ SOLN: 4.0000 mg | Freq: Once | INTRAMUSCULAR | Status: DC | PRN

## 2016-02-16 MED ORDER — ONDANSETRON HCL 4 MG/2ML IJ SOLN
INTRAMUSCULAR | Status: AC
  Administered 2016-02-16: 4 mg
  Filled 2016-02-16: qty 2

## 2016-02-16 MED ORDER — SOD CITRATE-CITRIC ACID 500-334 MG/5ML PO SOLN
ORAL | Status: AC
  Administered 2016-02-16: 30 mL
  Filled 2016-02-16: qty 15

## 2016-02-16 MED ORDER — LIDOCAINE 5 % EX PTCH
1.0000 | MEDICATED_PATCH | CUTANEOUS | Status: DC
  Administered 2016-02-17 – 2016-02-19 (×3): 1 via TRANSDERMAL
  Filled 2016-02-16 (×3): qty 1

## 2016-02-16 MED ORDER — OXYTOCIN 40 UNITS IN LACTATED RINGERS INFUSION - SIMPLE MED
INTRAVENOUS | Status: DC | PRN
  Administered 2016-02-16: 100 mL via INTRAVENOUS
  Administered 2016-02-16: 200 mL via INTRAVENOUS
  Administered 2016-02-16: 400 mL via INTRAVENOUS
  Administered 2016-02-16: 200 mL via INTRAVENOUS

## 2016-02-16 MED ORDER — DIBUCAINE 1 % RE OINT: 1.0000 "application " | TOPICAL_OINTMENT | RECTAL | Status: DC | PRN

## 2016-02-16 MED ORDER — LACTATED RINGERS IV SOLN
INTRAVENOUS | Status: DC
  Administered 2016-02-16: 12:00:00 via INTRAVENOUS

## 2016-02-16 MED ORDER — FENTANYL CITRATE (PF) 100 MCG/2ML IJ SOLN: 25.0000 ug | INTRAMUSCULAR | Status: DC | PRN

## 2016-02-16 MED ORDER — KETAMINE HCL 50 MG/ML IJ SOLN
INTRAMUSCULAR | Status: DC | PRN
  Administered 2016-02-16: 25 mg via INTRAMUSCULAR
  Administered 2016-02-16: 50 mg via INTRAMUSCULAR
  Administered 2016-02-16: 25 mg via INTRAMUSCULAR

## 2016-02-16 MED ORDER — LIDOCAINE 5 % EX PTCH
MEDICATED_PATCH | CUTANEOUS | Status: AC
  Filled 2016-02-16: qty 1

## 2016-02-16 MED ORDER — FENTANYL CITRATE (PF) 100 MCG/2ML IJ SOLN
INTRAMUSCULAR | Status: DC | PRN
  Administered 2016-02-16: 25 ug via INTRAVENOUS
  Administered 2016-02-16: 50 ug via INTRAVENOUS
  Administered 2016-02-16: 25 ug via INTRAVENOUS

## 2016-02-16 MED ORDER — VARICELLA VIRUS VACCINE LIVE 1350 PFU/0.5ML IJ SUSR
0.5000 mL | INTRAMUSCULAR | Status: AC | PRN
  Administered 2016-02-19: 0.5 mL via SUBCUTANEOUS
  Filled 2016-02-16: qty 0.5

## 2016-02-16 MED ORDER — IBUPROFEN 600 MG PO TABS
600.0000 mg | ORAL_TABLET | Freq: Four times a day (QID) | ORAL | Status: DC
  Administered 2016-02-17 (×2): 600 mg via ORAL
  Filled 2016-02-16 (×2): qty 1

## 2016-02-16 MED ORDER — LACTATED RINGERS IV SOLN: INTRAVENOUS | Status: DC

## 2016-02-16 MED ORDER — SIMETHICONE 80 MG PO CHEW
80.0000 mg | CHEWABLE_TABLET | ORAL | Status: DC | PRN
  Filled 2016-02-16: qty 1

## 2016-02-16 MED ORDER — WITCH HAZEL-GLYCERIN EX PADS: 1.0000 "application " | MEDICATED_PAD | CUTANEOUS | Status: DC | PRN

## 2016-02-16 MED ORDER — OXYCODONE HCL 5 MG PO TABS
5.0000 mg | ORAL_TABLET | ORAL | Status: DC | PRN
Start: 2016-02-16 — End: 2016-02-19
  Filled 2016-02-16: qty 1

## 2016-02-16 MED ORDER — PHENYLEPHRINE HCL 10 MG/ML IJ SOLN
INTRAMUSCULAR | Status: DC | PRN
  Administered 2016-02-16: 100 ug via INTRAVENOUS
  Administered 2016-02-16 (×2): 50 ug via INTRAVENOUS
  Administered 2016-02-16: 100 ug via INTRAVENOUS

## 2016-02-16 MED ORDER — COCONUT OIL OIL: 1.0000 "application " | TOPICAL_OIL | Status: DC | PRN

## 2016-02-16 MED ORDER — FERROUS SULFATE 325 (65 FE) MG PO TABS
325.0000 mg | ORAL_TABLET | Freq: Two times a day (BID) | ORAL | Status: DC
  Administered 2016-02-17 – 2016-02-19 (×5): 325 mg via ORAL
  Filled 2016-02-16 (×5): qty 1

## 2016-02-16 MED ORDER — KETOROLAC TROMETHAMINE 30 MG/ML IJ SOLN
30.0000 mg | Freq: Four times a day (QID) | INTRAMUSCULAR | Status: AC
  Administered 2016-02-16 – 2016-02-17 (×3): 30 mg via INTRAVENOUS
  Filled 2016-02-16 (×3): qty 1

## 2016-02-16 MED ORDER — OXYTOCIN 40 UNITS IN LACTATED RINGERS INFUSION - SIMPLE MED
INTRAVENOUS | Status: AC
Start: 2016-02-16 — End: 2016-02-16
  Administered 2016-02-16: 16:00:00
  Filled 2016-02-16: qty 1000

## 2016-02-16 MED ORDER — DEXTROSE 5 % IV SOLN
INTRAVENOUS | Status: AC
  Administered 2016-02-16: 14:00:00 via INTRAVENOUS
  Filled 2016-02-16 (×2): qty 9

## 2016-02-16 MED ORDER — ZOLPIDEM TARTRATE 5 MG PO TABS: 5.0000 mg | ORAL_TABLET | Freq: Every evening | ORAL | Status: DC | PRN

## 2016-02-16 MED ORDER — SIMETHICONE 80 MG PO CHEW
80.0000 mg | CHEWABLE_TABLET | ORAL | Status: DC
  Administered 2016-02-16: 80 mg via ORAL

## 2016-02-16 MED ORDER — MENTHOL 3 MG MT LOZG
1.0000 | LOZENGE | OROMUCOSAL | Status: DC | PRN
  Filled 2016-02-16: qty 9

## 2016-02-16 MED ORDER — LACTATED RINGERS IV SOLN
INTRAVENOUS | Status: DC
  Administered 2016-02-17: 06:00:00 via INTRAVENOUS

## 2016-02-16 MED ORDER — SENNOSIDES-DOCUSATE SODIUM 8.6-50 MG PO TABS
2.0000 | ORAL_TABLET | ORAL | Status: DC
  Administered 2016-02-16 – 2016-02-18 (×2): 2 via ORAL
  Filled 2016-02-16 (×2): qty 2

## 2016-02-16 MED ORDER — OXYCODONE HCL 5 MG PO TABS
ORAL_TABLET | ORAL | Status: AC
  Administered 2016-02-16: 10 mg
  Filled 2016-02-16: qty 2

## 2016-02-16 MED ORDER — DIPHENHYDRAMINE HCL 25 MG PO CAPS: 25.0000 mg | ORAL_CAPSULE | Freq: Four times a day (QID) | ORAL | Status: DC | PRN

## 2016-02-16 MED ORDER — METOPROLOL TARTRATE 5 MG/5ML IV SOLN
INTRAVENOUS | Status: DC | PRN
  Administered 2016-02-16: 5 mg via INTRAVENOUS
  Administered 2016-02-16: 3 mg via INTRAVENOUS

## 2016-02-16 MED ORDER — MIDAZOLAM HCL 2 MG/2ML IJ SOLN
INTRAMUSCULAR | Status: DC | PRN
  Administered 2016-02-16 (×2): 1 mg via INTRAVENOUS

## 2016-02-16 MED ORDER — LIDOCAINE 5 % EX PTCH
MEDICATED_PATCH | CUTANEOUS | Status: DC | PRN
  Administered 2016-02-16: 1 via TRANSDERMAL

## 2016-02-16 MED ORDER — MEASLES, MUMPS & RUBELLA VAC ~~LOC~~ INJ
0.5000 mL | INJECTION | SUBCUTANEOUS | Status: AC | PRN
  Administered 2016-02-19: 0.5 mL via SUBCUTANEOUS
  Filled 2016-02-16: qty 0.5

## 2016-02-16 MED ORDER — CLINDAMYCIN PHOSPHATE 900 MG/50ML IV SOLN: INTRAVENOUS | Status: DC | PRN

## 2016-02-16 MED ORDER — OXYCODONE HCL 5 MG PO TABS
10.0000 mg | ORAL_TABLET | ORAL | Status: DC | PRN
Start: 2016-02-16 — End: 2016-02-19
  Administered 2016-02-16 – 2016-02-19 (×13): 10 mg via ORAL
  Filled 2016-02-16 (×13): qty 2

## 2016-02-16 MED ORDER — PRENATAL MULTIVITAMIN CH
1.0000 | ORAL_TABLET | Freq: Every day | ORAL | Status: DC
  Administered 2016-02-17 – 2016-02-19 (×3): 1 via ORAL
  Filled 2016-02-16 (×4): qty 1

## 2016-02-16 MED ORDER — FAMOTIDINE 20 MG PO TABS: 20.0000 mg | ORAL_TABLET | Freq: Once | ORAL | Status: DC

## 2016-02-16 MED ORDER — BUPRENORPHINE HCL 8 MG SL SUBL
8.0000 mg | SUBLINGUAL_TABLET | Freq: Every day | SUBLINGUAL | Status: DC
  Administered 2016-02-17: 8 mg via SUBLINGUAL
  Filled 2016-02-16: qty 1

## 2016-02-16 SURGICAL SUPPLY — 24 items
BAG COUNTER SPONGE EZ (MISCELLANEOUS) ×2 IMPLANT
BENZOIN TINCTURE PRP APPL 2/3 (GAUZE/BANDAGES/DRESSINGS) ×2 IMPLANT
CANISTER SUCT 3000ML (MISCELLANEOUS) ×2 IMPLANT
CHLORAPREP W/TINT 26ML (MISCELLANEOUS) ×4 IMPLANT
DRSG TELFA 3X8 NADH (GAUZE/BANDAGES/DRESSINGS) ×2 IMPLANT
ELECT REM PT RETURN 9FT ADLT (ELECTROSURGICAL) ×2
ELECTRODE REM PT RTRN 9FT ADLT (ELECTROSURGICAL) ×1 IMPLANT
GAUZE SPONGE 4X4 12PLY STRL (GAUZE/BANDAGES/DRESSINGS) ×2 IMPLANT
GLOVE BIO SURGEON STRL SZ 6 (GLOVE) ×2 IMPLANT
GLOVE BIOGEL PI IND STRL 6.5 (GLOVE) ×1 IMPLANT
GLOVE BIOGEL PI INDICATOR 6.5 (GLOVE) ×1
GOWN STRL REUS W/ TWL LRG LVL3 (GOWN DISPOSABLE) ×2 IMPLANT
GOWN STRL REUS W/TWL LRG LVL3 (GOWN DISPOSABLE) ×2
KIT RM TURNOVER STRD PROC AR (KITS) ×2 IMPLANT
NS IRRIG 1000ML POUR BTL (IV SOLUTION) ×2 IMPLANT
PACK C SECTION AR (MISCELLANEOUS) ×2 IMPLANT
PAD OB MATERNITY 4.3X12.25 (PERSONAL CARE ITEMS) ×2 IMPLANT
PAD PREP 24X41 OB/GYN DISP (PERSONAL CARE ITEMS) ×2 IMPLANT
STRIP CLOSURE SKIN 1/2X4 (GAUZE/BANDAGES/DRESSINGS) ×2 IMPLANT
SUT MNCRL AB 4-0 PS2 18 (SUTURE) ×2 IMPLANT
SUT PLAIN 2 0 XLH (SUTURE) IMPLANT
SUT VIC AB 0 CT1 36 (SUTURE) ×8 IMPLANT
SUT VIC AB 3-0 SH 27 (SUTURE) ×1
SUT VIC AB 3-0 SH 27X BRD (SUTURE) ×1 IMPLANT

## 2016-02-16 NOTE — Progress Notes (Deleted)
Cesarean Section Procedure Note  Indications: previous uterine incision (low transverse C-section)  Pre-operative Diagnosis: 39 week 0 day pregnancy, obesity in pregnancy, prior C-section x 1 declined TOLAC.  Post-operative Diagnosis: Same  Surgeon: Hildred LaserAnika Cherry, MD  Assistants: Harlow MaresMelody Shambley, CNM  Procedure: Repeat low transverse Cesarean Section  Anesthesia: Spinal anesthesia  Procedure Details: The patient was seen in the Holding Room. The risks, benefits, complications, treatment options, and expected outcomes were discussed with the patient.  The patient concurred with the proposed plan, giving informed consent.  The site of surgery properly noted/marked. The patient was taken to the Operating Room, identified as Paulita FujitaJessica D Espy and the procedure verified as C-Section Delivery.   After induction of anesthesia, the patient was draped and prepped in the usual sterile manner. Anesthesia was tested and noted to be adequate. A Time Out was held and the above information confirmed. A Pfannenstiel incision was made and carried down through the subcutaneous tissue to the fascia. Fascial incision was made and extended transversely. The fascia was separated from the underlying rectus tissue superiorly and inferiorly. The peritoneum was identified and entered. Peritoneal incision was extended longitudinally. The utero-vesical peritoneal reflection was incised transversely and the bladder flap was bluntly freed from the lower uterine segment. A low transverse uterine incision was made. Delivered from cephalic presentation was a 3080 gram Female with Apgar scores of 8 at one minute and 9 at five minutes.After the umbilical cord was clamped and cut cord blood was obtained for evaluation. The placenta was removed intact and appeared normal. The uterus was exteriorized and cleared of all clots and debris. The uterine outline, tubes and ovaries appeared normal.  The uterine incision was closed with  running locked sutures of 0-Vicryl. Hemostasis was observed. Lavage was carried out until clear. The fascia was then reapproximated with a running suture of 0-Vicryl. The s skin was reapproximated with 4-0 Monocryl.  Instrument, sponge, and needle counts were correct prior the abdominal closure and at the conclusion of the case.   Findings: Female/Female infant, cephalic presentation, 3080 grams, with Apgar scores of 8 at one minute and 9 at five minutes. Intact placenta with 3 vessel cord.  Clear amniotic fluid at rupture.  The uterine outline, tubes and ovaries appeared normal.   Estimated Blood Loss:  775 ml      Drains: foley catheter to gravity drainage, 40 ml clear urine at end of the procedure         Total IV Fluids:  1800 ml  Specimens: None         Implants: None         Complications:  None; patient tolerated the procedure well.         Disposition: PACU - hemodynamically stable.         Condition: stable    Hildred LaserAnika Cherry, MD [facility]

## 2016-02-16 NOTE — Transfer of Care (Signed)
Immediate Anesthesia Transfer of Care Note  Patient: Tammy [name]  Procedure(s) Performed: Procedure(s) with comments: REPEAT CESAREAN SECTION (N/A) - Female @ 1435 Wt: 6lb 13oz Apgars: 8/9  Patient Location: PACU  Anesthesia Type:General  Level of Consciousness: sedated  Airway & Oxygen Therapy: Patient Spontanous Breathing and Patient connected to face mask oxygen  Post-op Assessment: Report given to RN and Post -op Vital signs reviewed and stable  Post vital signs: Reviewed and stable  Last Vitals:  Vitals:   02/16/16 1321 02/16/16 1528  BP: (!) 115/96 109/60  Pulse: 74 71  Resp: 20 10  Temp: 36.9 C     Complications: No apparent anesthesia complications

## 2016-02-16 NOTE — Anesthesia Preprocedure Evaluation (Signed)
Anesthesia Evaluation  Patient identified by MRN, date of birth, ID band Patient awake    Reviewed: Allergy & Precautions, NPO status , Patient's Chart, lab work & pertinent test results  Airway Mallampati: II       Dental no notable dental hx. (+) Chipped   Pulmonary asthma ,    Pulmonary exam normal        Cardiovascular negative cardio ROS Normal cardiovascular exam     Neuro/Psych PSYCHIATRIC DISORDERS Anxiety Depression ADHD   GI/Hepatic negative GI ROS, Neg liver ROS,   Endo/Other  negative endocrine ROS  Renal/GU negative Renal ROS  negative genitourinary   Musculoskeletal negative musculoskeletal ROS (+)   Abdominal Normal abdominal exam  (+)   Peds negative pediatric ROS (+)  Hematology negative hematology ROS (+)   Anesthesia Other Findings   Reproductive/Obstetrics (+) Pregnancy                             Anesthesia Physical Anesthesia Plan  ASA: II  Anesthesia Plan: Spinal   Post-op Pain Management:    Induction: Intravenous  Airway Management Planned: Nasal Cannula  Additional Equipment:   Intra-op Plan:   Post-operative Plan:   Informed Consent: I have reviewed the patients History and Physical, chart, labs and discussed the procedure including the risks, benefits and alternatives for the proposed anesthesia with the patient or authorized representative who has indicated his/her understanding and acceptance.   Dental advisory given  Plan Discussed with: CRNA and Surgeon  Anesthesia Plan Comments:         Anesthesia Quick Evaluation

## 2016-02-16 NOTE — H&P (Signed)
Obstetric Preoperative History and Physical  Tammy [name] is a 25 y.o. G3P0011 with IUP at [redacted]w[redacted]d presenting for presenting for scheduled repeat cesarean section. History of prior C-section x 1 for failure to progress.  No acute concerns.   Prenatal Course Source of Care: [facility] with onset of care at 12 weeks with midwife [facility]) Pregnancy complications or risks: Patient Active Problem List   Diagnosis Date Noted  . Labor and delivery, indication for care 12/30/2015  . H/O cesarean section complicating pregnancy 12/09/2015  . Rubella non-immune status, antepartum 07/26/2015  . Maternal varicella, non-immune 07/26/2015  . Marijuana use 07/26/2015  . Obesity 11/25/2014   She plans to breastfeed She desires Nexplanon for postpartum contraception.   Prenatal labs and studies: ABO, Rh: --/--/A POS (09/27 1306) Antibody: NEG (09/27 1306) Rubella: <20.0 (03/03 1541) RPR: Non Reactive (09/27 1306)  HBsAg: Negative (03/03 1541)  HIV: Non Reactive (09/27 1306)  GBS: Positive (09/22 1200) 1 hr Glucola  Normal (110) Genetic screening Negative Anatomy US normal   Past Medical History:  Diagnosis Date  . ADHD (attention deficit hyperactivity disorder)   . Anxiety   . Asthma   . Depression   . Herpes genitalia   . History of chlamydia   . Substance abuse     Past Surgical History:  Procedure Laterality Date  . CESAREAN SECTION  09/30/2013  . PEG TUBE PLACEMENT Bilateral 2011  . TONSILLECTOMY  2011    OB History  Gravida Para Term Preterm AB Living  3       1 1   SAB TAB Ectopic Multiple Live Births  1       1    # Outcome Date GA Lbr Len/2nd Weight Sex Delivery Anes PTL Lv  3 Current           2 Gravida 09/30/13    F CS-Unspec   LIV  1 SAB 2014              Social History   Social History  . Marital status: Single    Spouse name: N/A  . Number of children: N/A  . Years of education: N/A   Social History Main Topics  . Smoking  status: Never Smoker  . Smokeless tobacco: Never Used  . Alcohol use No  . Drug use: No  . Sexual activity: Yes   Other Topics Concern  . None   Social History Narrative  . None    Family History  Problem Relation Age of Onset  . Cancer Maternal Grandmother     thyroid    Prescriptions Prior to Admission  Medication Sig Dispense Refill Last Dose  . buprenorphine (SUBUTEX) 8 MG SUBL SL tablet Place 8 mg under the tongue daily.   02/15/2016 at Unknown time  . Prenatal Vit-Fe Fumarate-FA (PRENATAL MULTIVITAMIN) TABS tablet Take 1 tablet by mouth daily at 12 noon. Reported on 08/25/2015   02/15/2016 at Unknown time    Allergies  Allergen Reactions  . Hydrocodone-Acetaminophen Itching  . Morphine And Related Itching  . Other Swelling    SUBOXONE AND NALOXONE    . Peanuts [Peanut Oil] Swelling  . Penicillins Hives    Review of Systems: Negative except for what is mentioned in HPI.  Physical Exam: Ht 5\' 3"  (1.6 m)   Wt 160 lb (72.6 kg)   LMP 05/19/2015   BMI 28.34 kg/m  FHR by Doppler: 150 bpm GENERAL: Well-developed, well-nourished female in no acute distress.  LUNGS:  Clear to auscultation bilaterally.  HEART: Regular rate and rhythm. ABDOMEN: Soft, nontender, nondistended, gravid, well-healed Pfannenstiel incision. PELVIC: Deferred EXTREMITIES: Nontender, no edema, 2+ distal pulses.   Pertinent Labs/Studies:   Results for orders placed or performed during the hospital encounter of 02/15/16 (from the past 72 hour(s))  CBC     Status: None   Collection Time: 02/15/16  1:06 PM  Result Value Ref Range   WBC 10.6 3.6 - 11.0 K/uL   RBC 4.04 3.80 - 5.20 MIL/uL   Hemoglobin 12.8 12.0 - 16.0 g/dL   HCT 95.635.5 21.335.0 - 08.647.0 %   MCV 87.9 80.0 - 100.0 fL   MCH 31.7 26.0 - 34.0 pg   MCHC 36.0 32.0 - 36.0 g/dL   RDW 57.813.2 46.911.5 - 62.914.5 %   Platelets 163 150 - 440 K/uL  Type and screen [facility]     Status: None   Collection Time: 02/15/16  1:06 PM  Result  Value Ref Range   ABO/RH(D) A POS    Antibody Screen NEG    Sample Expiration 02/18/2016    Extend sample reason PREGNANT WITHIN 3 MONTHS, UNABLE TO EXTEND   HIV antibody     Status: None   Collection Time: 02/15/16  1:06 PM  Result Value Ref Range   HIV Screen 4th Generation wRfx Non Reactive Non Reactive    Comment: (NOTE) Performed At: [facility] [place] [address] [place], [place] [id number] Mila HomerHancock William F MD [id number]:[phone]   RPR     Status: None   Collection Time: 02/15/16  1:06 PM  Result Value Ref Range   RPR Ser Ql Non Reactive Non Reactive    Comment: (NOTE) Performed At: [facility] [place] [address] [place], [place] [id number] Mila HomerHancock William F MD [id number]:[phone]     Assessment and Plan :Tammy FujitaJessica D [name] is a 25 y.o. G3P0011 at 613w0d being admitted  for scheduled cesarean section delivery . The patient is understanding of the planned procedure and is aware of and accepting of all surgical risks, including but not limited to: bleeding which may require transfusion or reoperation; infection which may require antibiotics; injury to bowel, bladder, ureters or other surrounding organs which may require repair; injury to the fetus; need for additional procedures including hysterectomy in the event of life-threatening complications; placental abnormalities wth subsequent pregnancies; incisional problems; blood clot disorders which may require blood thinners;, and other postoperative/anesthesia complications. The patient is in agreement with the proposed plan, and gives informed written consent for the procedure. All questions have been answered. UDS ordered for patient's h/o marijuana use.    Hildred LaserAnika Cherry, MD [facility]

## 2016-02-16 NOTE — Anesthesia Procedure Notes (Signed)
Spinal  Patient location during procedure: OR Start time: 02/16/2016 2:10 PM End time: 02/16/2016 2:14 PM Staffing Anesthesiologist: Yves DillARROLL, PAUL Performed: anesthesiologist  Preanesthetic Checklist Completed: patient identified, site marked, surgical consent, pre-op evaluation, timeout performed, IV checked, risks and benefits discussed and monitors and equipment checked Spinal Block Patient position: sitting Prep: Betadine and site prepped and draped Patient monitoring: heart rate, cardiac monitor, continuous pulse ox and blood pressure Approach: midline Location: L3-4 Injection technique: single-shot Needle Needle type: Whitacre  Needle gauge: 25 G Needle length: 9 cm Assessment Sensory level: T4 Additional Notes Time out called.  Patient placed in sitting position.  Back prepped and draped in sterile fashion.  A skin wheal was made in the L3-L4 interspace with 1% Lidocaine plain.  A 25G Whitacre needle was advanced to the subarachnoid space with the return of clear, colorless CSF in all 4 quadrants.  No blood or paresthesias.  Patient tolerated the procedure well.  12 mg of spinal marcaine with 1: 200K epi was injected.  The level was T4T4 by cold and corner of alcohol pad.

## 2016-02-17 ENCOUNTER — Encounter: Payer: Self-pay | Admitting: Obstetrics and Gynecology

## 2016-02-17 LAB — VARICELLA ZOSTER ANTIBODY, IGG: Varicella IgG: <135 index — ABNORMAL LOW (ref >165)

## 2016-02-17 LAB — CBC
HCT: 35.4 % (ref 35.0–47.0)
Hemoglobin: 12.3 g/dL (ref 12.0–16.0)
MCH: 31.2 pg (ref 26.0–34.0)
MCHC: 34.7 g/dL (ref 32.0–36.0)
MCV: 90 fL (ref 80.0–100.0)
Platelets: 154 10*3/uL (ref 150–440)
RBC: 3.93 MIL/uL (ref 3.80–5.20)
RDW: 13.5 % (ref 11.5–14.5)
WBC: 12.3 10*3/uL — ABNORMAL HIGH (ref 3.6–11.0)

## 2016-02-17 LAB — URINE CULTURE
Culture: NO GROWTH
Special Requests: NORMAL

## 2016-02-17 LAB — RUBELLA SCREEN: Rubella: <0.9 index — ABNORMAL LOW (ref >0.99)

## 2016-02-17 MED ORDER — BUPRENORPHINE HCL 8 MG SL SUBL
8.0000 mg | SUBLINGUAL_TABLET | Freq: Two times a day (BID) | SUBLINGUAL | Status: DC
  Administered 2016-02-17 – 2016-02-19 (×4): 8 mg via SUBLINGUAL
  Filled 2016-02-17 (×4): qty 1

## 2016-02-17 MED ORDER — IBUPROFEN 600 MG PO TABS
600.0000 mg | ORAL_TABLET | Freq: Four times a day (QID) | ORAL | Status: DC
  Administered 2016-02-18 – 2016-02-19 (×7): 600 mg via ORAL
  Filled 2016-02-17 (×7): qty 1

## 2016-02-17 NOTE — Op Note (Addendum)
Cesarean Section Procedure Note  Indications: previous uterine incision (low transverse C-section)  Pre-operative Diagnosis: 39 week 0 day pregnancy, obesity in pregnancy, prior C-section x 1 declined TOLAC.  Post-operative Diagnosis: Same  Surgeon: Anika Cherry, MD  Assistants: Melody Shambley, CNM  Procedure: Repeat low transverse Cesarean Section  Anesthesia: Spinal anesthesia  Procedure Details: The patient was seen in the Holding Room. The risks, benefits, complications, treatment options, and expected outcomes were discussed with the patient.  The patient concurred with the proposed plan, giving informed consent.  The site of surgery properly noted/marked. The patient was taken to the Operating Room, identified as Erina D Krotz and the procedure verified as C-Section Delivery.   After induction of anesthesia, the patient was draped and prepped in the usual sterile manner. Anesthesia was tested and noted to be adequate. A Time Out was held and the above information confirmed. A Pfannenstiel incision was made and carried down through the subcutaneous tissue to the fascia. Fascial incision was made and extended transversely. The fascia was separated from the underlying rectus tissue superiorly and inferiorly. The peritoneum was identified and entered. Peritoneal incision was extended longitudinally. The utero-vesical peritoneal reflection was incised transversely and the bladder flap was bluntly freed from the lower uterine segment. A low transverse uterine incision was made. Delivered from cephalic presentation was a 3080 gram Female with Apgar scores of 8 at one minute and 9 at five minutes.After the umbilical cord was clamped and cut cord blood was obtained for evaluation. The placenta was removed intact and appeared normal. The uterus was exteriorized and cleared of all clots and debris. The uterine outline, tubes and ovaries appeared normal.  The uterine incision was closed with  running locked sutures of 0-Vicryl. Hemostasis was observed. Lavage was carried out until clear. The fascia was then reapproximated with a running suture of 0-Vicryl. The s skin was reapproximated with 4-0 Monocryl.  Instrument, sponge, and needle counts were correct prior the abdominal closure and at the conclusion of the case.   Findings: Female/Female infant, cephalic presentation, 3080 grams, with Apgar scores of 8 at one minute and 9 at five minutes. Intact placenta with 3 vessel cord.  Clear amniotic fluid at rupture.  The uterine outline, tubes and ovaries appeared normal.   Estimated Blood Loss:  775 ml      Drains: foley catheter to gravity drainage, 40 ml clear urine at end of the procedure         Total IV Fluids:  1800 ml  Specimens: None         Implants: None         Complications:  None; patient tolerated the procedure well.         Disposition: PACU - hemodynamically stable.         Condition: stable    Anika Cherry, MD [facility]   

## 2016-02-17 NOTE — Anesthesia Postprocedure Evaluation (Signed)
Anesthesia Post Note  Patient: Paulita FujitaJessica D Almgren  Procedure(s) Performed: Procedure(s) (LRB): REPEAT CESAREAN SECTION (N/A)  Patient location during evaluation: Mother Baby Anesthesia Type: Spinal Level of consciousness: awake Pain management: satisfactory to patient Vital Signs Assessment: post-procedure vital signs reviewed and stable Respiratory status: spontaneous breathing, nonlabored ventilation and respiratory function stable Cardiovascular status: stable Postop Assessment: no signs of nausea or vomiting, adequate PO intake and patient able to bend at knees Anesthetic complications: no    Last Vitals:  Vitals:   02/17/16 0420 02/17/16 0740  BP: (!) 101/58 98/61  Pulse: (!) 57 67  Resp: 17 18  Temp: 36.7 C 37 C    Last Pain:  Vitals:   02/17/16 0740  TempSrc: Oral  PainSc:                  Marlana SalvageSandra Jessup

## 2016-02-17 NOTE — Anesthesia Post-op Follow-up Note (Signed)
  Anesthesia Pain Follow-up Note  Patient: Tammy [name]  Day #: 1  Date of Follow-up: 02/17/2016 Time: 8:08 AM  Last Vitals:  Vitals:   02/17/16 0420 02/17/16 0740  BP: (!) 101/58 98/61  Pulse: (!) 57 67  Resp: 17 18  Temp: 36.7 C 37 C    Level of Consciousness: alert  Pain: none   Side Effects:None  Catheter Site Exam:clean     Plan: D/C from anesthesia care  Marlana SalvageSandra Jessup

## 2016-02-17 NOTE — Progress Notes (Signed)
Postpartum Day # 1: Cesarean Delivery  Subjective: Patient reports being tired.  Notes pain is controlled with meds.  Denies nausea/vomiting.  Tolerating diet.  Not yet ambulating.     Objective: Vital signs in last 24 hours: Temp:  [97.9 F (36.6 C)-98.5 F (36.9 C)] 98 F (36.7 C) (09/29 0420) Pulse Rate:  [50-111] 57 (09/29 0420) Resp:  [8-21] 17 (09/29 0420) BP: (96-115)/(54-96) 101/58 (09/29 0420) SpO2:  [98 %-100 %] 100 % (09/29 0420) Weight:  [160 lb (72.6 kg)] 160 lb (72.6 kg) (09/28 1119)  Physical Exam:  General: alert and no distress Lungs: clear to auscultation bilaterally Breasts: normal appearance, no masses or tenderness Heart: regular rate and rhythm, S1, S2 normal, no murmur, click, rub or gallop Pelvis: Lochia appropriate, Uterine Fundus firm, Incision: bandage clean/dry/intact Extremities: DVT Evaluation: No evidence of DVT seen on physical exam. Negative Homan's sign. No cords or calf tenderness. No significant calf/ankle edema.   Recent Labs  02/15/16 1306  HGB 12.8  HCT 35.5    Assessment/Plan: Status post repeat Cesarean section. Doing well postoperatively.  Breastfeeding Lactation consult if needed Contraception Nexplanon Advance diet Continue PO pain management Remove foley catheter Discontinue IVF Continue current care. Dispo: home in 1-2 days.   Hildred LaserAnika Cherry [facility]

## 2016-02-17 NOTE — Progress Notes (Signed)
Subjective: Post Op Day 1 Day Post-Op: Cesarean Delivery Patient reports incisional pain, tolerating PO, + flatus, no problems voiding and ambulating.    Objective: Vital signs in last 24 hours: Temp:  [97.9 F (36.6 C)-98.6 F (37 C)] 98.5 F (36.9 C) (09/29 1105) Pulse Rate:  [50-111] 73 (09/29 1105) Resp:  [8-21] 18 (09/29 1105) BP: (96-115)/(54-96) 104/66 (09/29 1105) SpO2:  [98 %-100 %] 100 % (09/29 0420)  Physical Exam:  General: alert, cooperative and appears stated age Lochia: appropriate Uterine Fundus: firm Incision: healing well, no significant drainage, dressing changed DVT Evaluation: No evidence of DVT seen on physical exam. Negative Homan's sign.   Recent Labs  02/15/16 1306 02/17/16 1013  HGB 12.8 12.3  HCT 35.5 35.4     Assessment/Plan: Status post Cesarean section. Doing well postoperatively.  Continue current care.  Melody NIKE Shambley, CNM 02/17/2016, 11:49 AM

## 2016-02-17 NOTE — Progress Notes (Signed)
RN in room to give pain meds, remove foley catheter; also, lab tech here to draw labs; RN had to wake pt to give pain meds (pt wanted RN to bring meds in when they were available); RN explained that lab was here to draw her blood and pt responded with "not now they're not, i'm tired"; RN said, "well, I have your pain meds and it's time to take your catheter out, so since you're awake now why don't we do everything at once and then you can go back to sleep?  Remember at [phone] I said I would return around 0615 to give you meds and take the catheter out?"; pt responded with, "can't you do that later, I don't want to do that now, i'm tired"; RN said "ok, do you still want you pain meds?"; pt replied "yeah"; pt sounded upset at this time; RN gave meds and left out of the room

## 2016-02-17 NOTE — Clinical Social Work Maternal (Signed)
  CLINICAL SOCIAL WORK MATERNAL/CHILD NOTE  Patient Details  Name: Tammy [name] MRN: [id number] Date of Birth: Dec 17, 1990  Date:  02/17/2016  Clinical Social Worker Initiating Note:  Shela Leff MSW,LCSW Date/ Time Initiated:  02/17/16/      Child's Name:      Legal Guardian:  Mother   Need for Interpreter:  None   Date of Referral:  02/17/16     Reason for Referral:  Current Substance Use/Substance Use During Pregnancy    Referral Source:  RN   Address:     Phone number:      Household Members:  Self, Significant Other, Minor Children   Natural Supports (not living in the home):  Extended Family, Immediate Family   Professional Supports: None   Employment: Unemployed   Type of Work:     Education:      Pensions consultant:  [employer]   Other Resources:  [employer]   Cultural/Religious Considerations Which May Impact Care:  none  Strengths:  Ability to meet basic needs , Home prepared for child    Risk Factors/Current Problems:  Intellectual Psychologist, counselling Disorder    Cognitive State:  Alert , Linear Thinking    Mood/Affect:  Calm , Relaxed , Blunted    CSW Assessment: CSW met with patient this afternoon and her grandmother was present. Patient stated she remembered me from when she was here at the hospital 2 years ago to deliver her first baby. Patient was very honest with CSW and stated that she has difficulty learning and that it takes longer for her to understand things that would be easy for the average person to understand. She stated that she was in special classes in school. Patient states that in the home is her significant other and father of both her children. Patient states that they have all the things that they will need for their newborn and that she has family that can assist her if she needs it. CSW looked toward patient's grandmother and she nodded and stated that patient does have family that can help if needed. Patient denies any history of  mental illness other than anxiety. She states she used to take xanax. Her understanding of why she was on subutex was not clear. She states she goes to a clinic in [place] to receive her subutex. She states that she did not smoke marijuana but a few times and that when she found out she was pregnant she did not continue to use and does not plan to use again. CSW explained to her and her grandmother that patient's urine drug screen was negative for illicit substances and that the cord blood test was pending. CSW informed patient that if the cord blood returns positive, then a DSS CPS report will need to be made. Patient verbalized that she understood this. CSW will continue to follow.   CSW Plan/Description:  Psychosocial Support and Ongoing Assessment of Needs    Shela Leff, LCSW 02/17/2016, 4:05 PM

## 2016-02-17 NOTE — Progress Notes (Signed)
RN in room to do fundal check. Pt started swatting at RN and started yelling "stop it hurts" RN informed pt of the importance of conducting this assessment, pt refused.

## 2016-02-18 NOTE — Progress Notes (Signed)
At 1000, went in to give Subcutex. Mom laying in bed with sleeping baby, she herself apparently asleep, for the second time this shift. Mother stated she "was not asleep, just had a headache." At the first instance, around 0830, Mom permitted RNs to put baby back in the bassinet before she went back to sleep, and it was reinforced that it is better for the baby to sleep in separately from mom. At 1000, Mom expressed she wanted to keep baby beside her in bed, refused to have her put in the bassinet as she was awake. RN again reinforced patient education, reminding Mom that if she were to go to sleep to put baby back in the bassinet due to the risks of harming baby by accident should they sleep in the same bed.

## 2016-02-18 NOTE — Progress Notes (Signed)
Pt had refused to have her temp checked at 0100 by NT; RN had discussed feeding plan with parents at 0000 on 02-18-16; baby needs to eat by 0100 (that will be 4 hours from last feeding); baby is currently 6lbs 8.8oz (a 3.6% weight loss from birth) and this is WNL; father of baby said "i'll feed her at 1"; NT in room to get baby temp and ask if baby has eaten yet; no response from parents; NT reminded parents that "it's time to feed your baby"; no response from parents; NT came to get RN; RN in room to wake parents to feed baby; RN said, "remember an hour ago we discussed that the baby needed to eat by 1am?  Sir, you said you would feed the baby"; no response from either parent; RN said, "Mrs Tammy [name], i'll get the bottle ready and you or dad need to feed the baby"; no response from mother of baby; father of baby said (from beneath his blanket) "she'll feed the baby"; mother of baby appears asleep; RN said, "sir, I think she is sleeping, you remember an hour ago you said you would feed the baby at 1am; it doesn't matter who feeds the baby, I'll get the bottle ready but one of you needs to feed the baby please"; father of baby finally sat up and said, "i'll feed her"; he reached for baby and asked mother of baby to hand him the baby and she had no response; RN picked baby up and handed baby to father along with bottle; mother of baby never responded during this conversation

## 2016-02-18 NOTE — Progress Notes (Signed)
RN in room at 0530 to give pain meds; pt awake, was very pleasant; pt just finished feeding the baby and changing the baby's diaper; pt asked RN "wrap the baby"; pt had good questions (about going home) at this time; positive interaction

## 2016-02-18 NOTE — Progress Notes (Signed)
Subjective: Post Op Day 2 Days Post-Op: Cesarean Delivery Patient reports incisional pain, tolerating PO, + flatus and no problems voiding.  States she doesn't want to walk because it hurts.  Objective: Vital signs in last 24 hours: Temp:  [97.9 F (36.6 C)-98.7 F (37.1 C)] 98.7 F (37.1 C) (09/30 1649) Pulse Rate:  [61-82] 63 (09/30 1134) Resp:  [16-18] 18 (09/30 1134) BP: (100-114)/(54-66) 114/54 (09/30 1134) SpO2:  [99 %-100 %] 99 % (09/29 2043)  Physical Exam:  General: alert, cooperative, appears stated age, slowed mentation and flat affect Lochia: appropriate Uterine Fundus: firm Incision: healing well, no significant drainage DVT Evaluation: No evidence of DVT seen on physical exam. Negative Homan's sign.   Recent Labs  02/17/16 1013  HGB 12.3  HCT 35.4     Assessment/Plan: Status post Cesarean section. Doing well postoperatively.  Continue current care. Strongly encouraged ambulation tonight with shower, and ambulation again in am. Patient stated she would.  Melody N Shambley 02/18/2016, 5:37 PM

## 2016-02-18 NOTE — Progress Notes (Signed)
Subjective: Postpartum Day 2: Cesarean Delivery Patient reports incisional pain.  Mild voiding discomfort. No flatus. Not ambulating.  Objective: Vital signs in last 24 hours: Temp:  [97.9 F (36.6 C)-98.3 F (36.8 C)] 98 F (36.7 C) (09/30 1134) Pulse Rate:  [61-82] 63 (09/30 1134) Resp:  [16-18] 18 (09/30 1134) BP: (100-114)/(54-66) 114/54 (09/30 1134) SpO2:  [99 %-100 %] 99 % (09/29 2043)  Physical Exam:  General: alert and cooperative Lochia: appropriate Uterine Fundus: firm Incision: healing well, no significant drainage, no dehiscence DVT Evaluation: No evidence of DVT seen on physical exam.   Recent Labs  02/15/16 1306 02/17/16 1013  HGB 12.8 12.3  HCT 35.5 35.4    Assessment/Plan: Status post Cesarean section. Doing well postoperatively. Needs to ambulate. Probable discharge tomorrow.  Tammy [name] 02/18/2016, 11:55 AM

## 2016-02-19 LAB — STREP GP B SUSCEPTIBILITY

## 2016-02-19 LAB — STREP GP B NAA+RFLX: Strep Gp B NAA+Rflx: POSITIVE — AB

## 2016-02-19 MED ORDER — OXYCODONE HCL 5 MG PO TABS: 5.0000 mg | ORAL_TABLET | ORAL | 0 refills | Status: DC | PRN

## 2016-02-19 MED ORDER — IBUPROFEN 600 MG PO TABS: 600.0000 mg | ORAL_TABLET | Freq: Four times a day (QID) | ORAL | 0 refills | Status: DC

## 2016-02-19 NOTE — Discharge Summary (Signed)
Obstetric Discharge Summary Reason for Admission: cesarean section Prenatal Procedures: ultrasound Intrapartum Procedures: cesarean: low cervical, transverse Postpartum Procedures: Rubella Ig and varicella vaccine Complications-Operative and Postpartum: none Hemoglobin  Date Value Ref Range Status  02/17/2016 12.3 12.0 - 16.0 g/dL Final   HGB  Date Value Ref Range Status  09/29/2013 12.4 12.0 - 16.0 g/dL Final   HCT  Date Value Ref Range Status  02/17/2016 35.4 35.0 - 47.0 % Final   Hematocrit  Date Value Ref Range Status  12/08/2015 35.7 34.0 - 46.6 % Final    Physical Exam:  General: alert, cooperative, appears stated age and frustrated at having to leave baby for NAS observation Lochia: appropriate Uterine Fundus: firm Incision: healing well, no significant drainage DVT Evaluation: No evidence of DVT seen on physical exam. Negative Homan's sign.  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  Melody NIKE Shambley, CNM 02/19/2016, 10:20 AM

## 2016-02-19 NOTE — Progress Notes (Signed)
Discharge instr reviewed with pt.  Reinforced with patient that her status is not inpatient anymore and that she will be provided food, but no nursing care.  She has her meds and a medication time administration sheet that she can use.

## 2016-02-28 ENCOUNTER — Telehealth: Payer: Self-pay | Admitting: Obstetrics and Gynecology

## 2016-02-28 NOTE — Telephone Encounter (Signed)
PT CALLED AND SHE IS HAVING A LOT OF PAIN IN HER RIGHT SIDE, SHE HAD RECENT C-SECTION

## 2016-02-28 NOTE — Telephone Encounter (Signed)
Pt is having pain in her right side, per pt states its been hurting since her c-section, denies any urinary sx, having normal bm, she did state she was having some vaginal d/c, pls advise

## 2016-02-29 NOTE — Telephone Encounter (Signed)
Called pt she is coming in 03/01/16 per MNS

## 2016-02-29 NOTE — Telephone Encounter (Signed)
Never seen for postop check, please work in

## 2016-03-01 ENCOUNTER — Ambulatory Visit (INDEPENDENT_AMBULATORY_CARE_PROVIDER_SITE_OTHER): Payer: Medicaid Other | Admitting: Obstetrics and Gynecology

## 2016-03-01 ENCOUNTER — Encounter: Payer: Self-pay | Admitting: Obstetrics and Gynecology

## 2016-03-01 VITALS — BP 98/68 | HR 94 | Ht 62.0 in | Wt 148.0 lb

## 2016-03-01 DIAGNOSIS — Z3042 Encounter for surveillance of injectable contraceptive: Secondary | ICD-10-CM | POA: Diagnosis not present

## 2016-03-01 DIAGNOSIS — Z98891 History of uterine scar from previous surgery: Secondary | ICD-10-CM

## 2016-03-01 LAB — POCT URINALYSIS DIPSTICK
Bilirubin, UA: NEGATIVE
Blood, UA: NEGATIVE
Glucose, UA: NEGATIVE
Ketones, UA: NEGATIVE
Nitrite, UA: NEGATIVE
Spec Grav, UA: 1.01
Urobilinogen, UA: 0.2
pH, UA: 6.5

## 2016-03-01 LAB — POCT URINE PREGNANCY: Preg Test, Ur: NEGATIVE

## 2016-03-01 MED ORDER — MEDROXYPROGESTERONE ACETATE 150 MG/ML IM SUSP
150.0000 mg | Freq: Once | INTRAMUSCULAR | Status: AC
  Administered 2016-03-01: 150 mg via INTRAMUSCULAR

## 2016-03-01 MED ORDER — MEDROXYPROGESTERONE ACETATE 150 MG/ML IM SUSP: 150.0000 mg | INTRAMUSCULAR | 4 refills | Status: DC

## 2016-03-01 NOTE — Patient Instructions (Signed)
  Thank you for enrolling in MyChart. Please follow the instructions below to securely access your online medical record. MyChart allows you to send messages to your doctor, view your test results, manage appointments, and more.   How Do I Sign Up? 1. In your Internet browser, go to [employer] and enter [email or web address]. 2. Click on the Sign Up Now link in the Sign In box. You will see the New Member Sign Up page. 3. Enter your MyChart Access Code exactly as it appears below. You will not need to use this code after you've completed the sign-up process. If you do not sign up before the expiration date, you must request a new code.  MyChart Access Code: [email or web address] Expires: 03/09/2016  2:19 PM  4. Enter your Social Security Number ([id number]) and Date of Birth (mm/dd/yyyy) as indicated and click Submit. You will be taken to the next sign-up page. 5. Create a MyChart ID. This will be your MyChart login ID and cannot be changed, so think of one that is secure and easy to remember. 6. Create a MyChart password. You can change your password at any time. 7. Enter your Password Reset Question and Answer. This can be used at a later time if you forget your password.  8. Enter your e-mail address. You will receive e-mail notification when new information is available in MyChart. 9. Click Sign Up. You can now view your medical record.   Additional Information Remember, MyChart is NOT to be used for urgent needs. For medical emergencies, dial 911.

## 2016-03-01 NOTE — Progress Notes (Signed)
   Subjective:     Tammy [name] is a 25 y.o. female who presents to the clinic 2 weeks status post repeat LTCS for elective repeat LTCS. Eating a regular diet without difficulty. Bowel movements are normal. states incisional pain and right abdominal muscle pain with activity  The following portions of the patient's history were reviewed and updated as appropriate: allergies, current medications, past family history, past medical history, past social history, past surgical history and problem list.  Review of Systems Pertinent items noted in HPI and remainder of comprehensive ROS otherwise negative.    Objective:    BP 98/68   Pulse 94   Ht 5\' 2"  (1.575 m)   Wt 148 lb (67.1 kg)   LMP 05/19/2015   Breastfeeding? No   BMI 27.07 kg/m  General:  alert, cooperative and appears stated age  Abdomen: soft, bowel sounds active, non-tender, no abnormal masses  Incision:   healing well, no drainage, no erythema, no hernia, no seroma, no swelling, no dehiscence, incision well approximated    UPT negative, with faint + line noted 1 hour later  Assessment:    Doing well postoperatively. Contraception management.    Plan:    1. Continue any current medications. Depo injection given today. UPT wasn't noted until after patient left office. Reasonable that it is a false positive and will recheck at 4 week visit. 2. Wound care discussed. 3. Activity restrictions: light activity 4. Anticipated return to work: NA. 5. Follow up: 4 weeks for postpartum exam.

## 2016-03-29 ENCOUNTER — Encounter: Payer: Self-pay | Admitting: Obstetrics and Gynecology

## 2016-03-29 ENCOUNTER — Ambulatory Visit (INDEPENDENT_AMBULATORY_CARE_PROVIDER_SITE_OTHER): Payer: Medicaid Other | Admitting: Obstetrics and Gynecology

## 2016-03-29 DIAGNOSIS — F419 Anxiety disorder, unspecified: Secondary | ICD-10-CM

## 2016-03-29 NOTE — Patient Instructions (Signed)
  Place postpartum visit patient instructions here.  

## 2016-03-29 NOTE — Progress Notes (Signed)
Subjective:     Tammy [name] is a 25 y.o. female who presents for a postpartum visit. She is 6 weeks postpartum following a low cervical transverse Cesarean section. I have fully reviewed the prenatal and intrapartum course. The delivery was at 39 gestational weeks. Outcome: repeat cesarean section, low transverse incision. Anesthesia: spinal. Postpartum course has been uncomplicated. Baby's course has been uncomplicated. Baby is feeding by bottle - formula. Bleeding small amount of bleeding. Bowel function is normal. Bladder function is normal. Patient is sexually active. Contraception method is Depo-Provera injections. Postpartum depression screening: negative.  The following portions of the patient's history were reviewed and updated as appropriate: allergies, current medications, past family history, past medical history, past social history, past surgical history and problem list.  Review of Systems Pertinent items noted in HPI and remainder of comprehensive ROS otherwise negative.   Objective:    BP 103/68   Pulse 65   Ht 5\' 3"  (1.6 m)   Wt 151 lb 9.6 oz (68.8 kg)   Breastfeeding? No Comment: bleeding with depo provera  BMI 26.85 kg/m   General:  alert, cooperative and appears stated age   Breasts:  inspection negative, no nipple discharge or bleeding, no masses or nodularity palpable  Lungs: clear to auscultation bilaterally  Heart:  regular rate and rhythm, S1, S2 normal, no murmur, click, rub or gallop  Abdomen: soft, non-tender; bowel sounds normal; no masses,  no organomegaly Incision with e   Vulva:  normal  Vagina: normal vagina, no discharge, exudate, lesion, or erythema  Cervix:  no cervical motion tenderness  Corpus: normal size, contour, position, consistency, mobility, non-tender  Adnexa:  normal adnexa  Rectal Exam: Normal rectovaginal exam        Assessment:   Normal postpartum exam. Pap smear not done at today's visit.   Plan:    1. Contraception:  Depo-Provera injections 2. Xanax given for anxiety PRN. 3. Follow up in: 6 weeks for next depo injection or as needed.

## 2016-03-30 LAB — CBC
Hematocrit: 37.1 % (ref 34.0–46.6)
Hemoglobin: 12.8 g/dL (ref 11.1–15.9)
MCH: 29.9 pg (ref 26.6–33.0)
MCHC: 34.5 g/dL (ref 31.5–35.7)
MCV: 87 fL (ref 79–97)
Platelets: 256 10*3/uL (ref 150–379)
RBC: 4.28 x10E6/uL (ref 3.77–5.28)
RDW: 12.4 % (ref 12.3–15.4)
WBC: 7.7 10*3/uL (ref 3.4–10.8)

## 2016-03-30 LAB — IRON: Iron: 39 ug/dL (ref 27–159)

## 2016-03-30 LAB — VITAMIN D 25 HYDROXY (VIT D DEFICIENCY, FRACTURES): Vit D, 25-Hydroxy: 32.5 ng/mL (ref 30.0–100.0)

## 2016-04-10 ENCOUNTER — Telehealth: Payer: Self-pay | Admitting: *Deleted

## 2016-04-10 NOTE — Telephone Encounter (Signed)
Pt states she originally was taking xanax 1mg  bid, they helped her so much, she is currently taking 0.5mg  and has been taking 2, rx is going to run out would like new rx sent to pharmacy

## 2016-04-11 ENCOUNTER — Other Ambulatory Visit: Payer: Self-pay | Admitting: Obstetrics and Gynecology

## 2016-04-11 ENCOUNTER — Telehealth: Payer: Self-pay | Admitting: Obstetrics and Gynecology

## 2016-04-11 MED ORDER — ALPRAZOLAM 1 MG PO TABS: 1.0000 mg | ORAL_TABLET | Freq: Every evening | ORAL | 0 refills | Status: DC | PRN

## 2016-04-11 NOTE — Telephone Encounter (Signed)
Dori WANTS TO KNOW IF YOU ARE GOING TO SEND THE RX TO THE PHARMACY THAT YA'LL TALKED ABOUT.

## 2016-04-11 NOTE — Telephone Encounter (Signed)
Medication faxed to pharmacy 

## 2016-04-11 NOTE — Telephone Encounter (Signed)
Done. She will need to see a PCP for more prescriptions in the future.

## 2016-04-26 ENCOUNTER — Telehealth: Payer: Self-pay | Admitting: Obstetrics and Gynecology

## 2016-04-26 NOTE — Telephone Encounter (Signed)
Patient called stating she needs a refill on several medications but I couldn't really understand which ones. She also would like a call back. Thanks

## 2016-04-27 ENCOUNTER — Other Ambulatory Visit: Payer: Self-pay | Admitting: *Deleted

## 2016-04-27 MED ORDER — ALPRAZOLAM 1 MG PO TABS: 1.0000 mg | ORAL_TABLET | Freq: Every evening | ORAL | 0 refills | Status: DC | PRN

## 2016-04-30 NOTE — Telephone Encounter (Signed)
Pt came in office on 04/27/16 got her xanax rx, was told she needs to get with PCP, she has [facility] on her medicaid card, she is going to call around and try to find a PCP

## 2016-05-16 ENCOUNTER — Ambulatory Visit: Payer: Medicaid Other

## 2016-05-17 ENCOUNTER — Ambulatory Visit: Payer: Medicaid Other

## 2016-05-17 ENCOUNTER — Encounter: Payer: Self-pay | Admitting: Obstetrics and Gynecology

## 2016-05-27 ENCOUNTER — Emergency Department: Payer: Medicaid Other

## 2016-05-27 ENCOUNTER — Emergency Department
Admission: EM | Admit: 2016-05-27 | Discharge: 2016-05-27 | Disposition: A | Discharge status: Home / Self Care | Payer: Medicaid Other | Attending: Emergency Medicine | Admitting: Emergency Medicine

## 2016-05-27 ENCOUNTER — Encounter: Payer: Self-pay | Admitting: Emergency Medicine

## 2016-05-27 DIAGNOSIS — R0789 Other chest pain: Secondary | ICD-10-CM | POA: Insufficient documentation

## 2016-05-27 DIAGNOSIS — F909 Attention-deficit hyperactivity disorder, unspecified type: Secondary | ICD-10-CM | POA: Diagnosis not present

## 2016-05-27 DIAGNOSIS — Z79899 Other long term (current) drug therapy: Secondary | ICD-10-CM | POA: Diagnosis not present

## 2016-05-27 DIAGNOSIS — J45909 Unspecified asthma, uncomplicated: Secondary | ICD-10-CM | POA: Insufficient documentation

## 2016-05-27 LAB — CBC
HCT: 37.2 % (ref 35.0–47.0)
Hemoglobin: 12.9 g/dL (ref 12.0–16.0)
MCH: 29.8 pg (ref 26.0–34.0)
MCHC: 34.8 g/dL (ref 32.0–36.0)
MCV: 85.5 fL (ref 80.0–100.0)
Platelets: 246 10*3/uL (ref 150–440)
RBC: 4.35 MIL/uL (ref 3.80–5.20)
RDW: 12.8 % (ref 11.5–14.5)
WBC: 5.4 10*3/uL (ref 3.6–11.0)

## 2016-05-27 LAB — BASIC METABOLIC PANEL
Anion gap: 4 — ABNORMAL LOW (ref 5–15)
BUN: 10 mg/dL (ref 6–20)
CO2: 27 mmol/L (ref 22–32)
Calcium: 9.2 mg/dL (ref 8.9–10.3)
Chloride: 107 mmol/L (ref 101–111)
Creatinine, Ser: 0.74 mg/dL (ref 0.44–1.00)
GFR calc Af Amer: >60 mL/min (ref >60)
GFR calc non Af Amer: >60 mL/min (ref >60)
Glucose, Bld: 83 mg/dL (ref 65–99)
Potassium: 4 mmol/L (ref 3.5–5.1)
Sodium: 138 mmol/L (ref 135–145)

## 2016-05-27 LAB — TROPONIN I: Troponin I: <0.03 ng/mL (ref <0.03)

## 2016-05-27 MED ORDER — GI COCKTAIL ~~LOC~~
30.0000 mL | Freq: Once | ORAL | Status: AC
  Administered 2016-05-27: 30 mL via ORAL

## 2016-05-27 MED ORDER — GI COCKTAIL ~~LOC~~
ORAL | Status: AC
  Filled 2016-05-27: qty 30

## 2016-05-27 NOTE — ED Provider Notes (Signed)
[facility] Emergency Department Provider Note   ____________________________________________    I have reviewed the triage vital signs and the nursing notes.   HISTORY  Chief Complaint Chest Pain     HPI Tammy [name] is a 26 y.o. female who presents with complaints of chest pain. Patient reports that around 11 AM today she developed a burning in her chest, she took Tums and drank some milk which did not help. She has a history of heartburn in the past. She denies shortness of breath. No recent travel. No calf pain or swelling. No history of heart disease.   Past Medical History:  Diagnosis Date  . ADHD (attention deficit hyperactivity disorder)   . Anxiety   . Asthma   . Depression   . Herpes genitalia   . History of chlamydia   . Substance abuse     Patient Active Problem List   Diagnosis Date Noted  . Anxiety 03/29/2016  . S/P cesarean section 02/16/2016  . Marijuana use 07/26/2015    Past Surgical History:  Procedure Laterality Date  . CESAREAN SECTION  09/30/2013  . CESAREAN SECTION N/A 02/16/2016   Procedure: REPEAT CESAREAN SECTION;  Surgeon: Hildred Laser, MD;  Location: [facility] ORS;  Service: Obstetrics;  Laterality: N/A;  Female @ 1435 Wt: 6lb 13oz Apgars: 8/9  . PEG TUBE PLACEMENT Bilateral 2011  . TONSILLECTOMY  2011    Prior to Admission medications   Medication Sig Start Date End Date Taking? Authorizing Provider  ALPRAZolam Prudy Feeler) 1 MG tablet Take 1 tablet (1 mg total) by mouth at bedtime as needed for anxiety. 04/27/16   Melody N Shambley, CNM  buprenorphine (SUBUTEX) 8 MG SUBL SL tablet Place 8 mg under the tongue daily.    Historical Provider, MD  medroxyPROGESTERone (DEPO-PROVERA) 150 MG/ML injection Inject 1 mL (150 mg total) into the muscle every 3 (three) months. 03/01/16   Melody Suzan Nailer, CNM  Prenatal Vit-Fe Fumarate-FA (PRENATAL MULTIVITAMIN) TABS tablet Take 1 tablet by mouth daily at 12 noon. Reported on  08/25/2015    Historical Provider, MD     Allergies Hydrocodone-acetaminophen; Morphine and related; Other; Peanuts [peanut oil]; and Penicillins  Family History  Problem Relation Age of Onset  . Cancer Maternal Grandmother     thyroid    Social History Social History  Substance Use Topics  . Smoking status: Never Smoker  . Smokeless tobacco: Never Used  . Alcohol use No    Review of Systems  Constitutional: No fever/chills Eyes: No visual changes.   Cardiovascular: As above Respiratory: Denies shortness of breath. Gastrointestinal: No abdominal pain.  No nausea, no vomiting.    Musculoskeletal: Negative for back pain. Skin: Negative for rash. Neurological: Negative for headaches   10-point ROS otherwise negative.  ____________________________________________   PHYSICAL EXAM:  VITAL SIGNS: ED Triage Vitals  Enc Vitals Group     BP 05/27/16 1708 115/74     Pulse Rate 05/27/16 1708 74     Resp 05/27/16 1708 16     Temp 05/27/16 1708 98.3 F (36.8 C)     Temp Source 05/27/16 1708 Oral     SpO2 05/27/16 1708 100 %     Weight 05/27/16 1703 150 lb (68 kg)     Height 05/27/16 1703 5\' 3"  (1.6 m)     Head Circumference --      Peak Flow --      Pain Score 05/27/16 1704 10     Pain  Loc --      Pain Edu? --      Excl. in GC? --     Constitutional: Alert and oriented. No acute distress. Pleasant and interactive Eyes: Conjunctivae are normal.   Nose: No congestion/rhinnorhea. Mouth/Throat: Mucous membranes are moist.    Cardiovascular: Normal rate, regular rhythm. Grossly normal heart sounds.  Good peripheral circulation. Respiratory: Normal respiratory effort.  No retractions. Lungs CTAB. Gastrointestinal: Soft and nontender. No distention.  No CVA tenderness. Genitourinary: deferred Musculoskeletal: No lower extremity tenderness nor edema.  Warm and well perfused Neurologic:  Normal speech and language. No gross focal neurologic deficits are appreciated.    Skin:  Skin is warm, dry and intact. No rash noted. Psychiatric: Mood and affect are normal. Speech and behavior are normal.  ____________________________________________   LABS (all labs ordered are listed, but only abnormal results are displayed)  Labs Reviewed  BASIC METABOLIC PANEL - Abnormal; Notable for the following:       Result Value   Anion gap 4 (*)    All other components within normal limits  CBC  TROPONIN I   ____________________________________________  EKG  ED ECG REPORT I, KINNER, ROBERT, the attJene Everyending physician, personally viewed and interpreted this ECG.  Date: 05/27/2016  Rate: 70 Rhythm: normal sinus rhythm QRS Axis: normal Intervals: normal ST/T Wave abnormalities: normal Conduction Disturbances: none Narrative Interpretation: unremarkable  ____________________________________________  RADIOLOGY  Chest x-ray unremarkable ____________________________________________   PROCEDURES  Procedure(s) performed: No    Critical Care performedNo ____________________________________________   INITIAL IMPRESSION / ASSESSMENT AND PLAN / ED COURSE  Pertinent labs & imaging results that were available during my care of the patient were reviewed by me and considered in my medical decision making (see chart for details).  Patient presents with burning in her chest. EKG, labs, chest x-ray are all reassuring. She was treated with a GI cocktail which completely resolved her pain. Suspect acid reflux as the cause of her discomfort. Not consistent with ACS myocarditis, PE  Clinical Course   Given that she feels well and has no pain stable for discharge return precautions discussed ____________________________________________   FINAL CLINICAL IMPRESSION(S) / ED DIAGNOSES  Final diagnoses:  Atypical chest pain      NEW MEDICATIONS STARTED DURING THIS VISIT:  Discharge Medication List as of 05/27/2016  6:38 PM       Note:  This document was  prepared using Dragon voice recognition software and may include unintentional dictation errors.    Jene Everyobert Kinner, MD 05/27/16 [id number]

## 2016-05-27 NOTE — ED Notes (Signed)
AAOx3.  Skin warm and dry.  NAD 

## 2016-05-27 NOTE — ED Triage Notes (Signed)
C/O intermittent upper chest pain today.  Pain unable to associate symptoms with any activity.  Denies SOB.  Denies cough.

## 2016-07-16 ENCOUNTER — Emergency Department
Admission: EM | Admit: 2016-07-16 | Discharge: 2016-07-16 | Disposition: A | Discharge status: Home / Self Care | Payer: Medicaid Other | Attending: Emergency Medicine | Admitting: Emergency Medicine

## 2016-07-16 ENCOUNTER — Encounter: Payer: Self-pay | Admitting: Emergency Medicine

## 2016-07-16 DIAGNOSIS — R519 Headache, unspecified: Secondary | ICD-10-CM

## 2016-07-16 DIAGNOSIS — F909 Attention-deficit hyperactivity disorder, unspecified type: Secondary | ICD-10-CM | POA: Insufficient documentation

## 2016-07-16 DIAGNOSIS — J45909 Unspecified asthma, uncomplicated: Secondary | ICD-10-CM | POA: Diagnosis not present

## 2016-07-16 DIAGNOSIS — R51 Headache: Secondary | ICD-10-CM | POA: Diagnosis present

## 2016-07-16 DIAGNOSIS — Z79899 Other long term (current) drug therapy: Secondary | ICD-10-CM | POA: Diagnosis not present

## 2016-07-16 MED ORDER — KETOROLAC TROMETHAMINE 60 MG/2ML IM SOLN
30.0000 mg | Freq: Once | INTRAMUSCULAR | Status: AC
  Administered 2016-07-16: 30 mg via INTRAMUSCULAR
  Filled 2016-07-16: qty 2

## 2016-07-16 MED ORDER — PROMETHAZINE HCL 25 MG/ML IJ SOLN
12.5000 mg | Freq: Once | INTRAMUSCULAR | Status: AC
  Administered 2016-07-16: 12.5 mg via INTRAMUSCULAR
  Filled 2016-07-16: qty 1

## 2016-07-16 NOTE — ED Triage Notes (Signed)
presents with a 1 week hx of generalized headache  No fever or photosensivity.states no relief with ibu or tylenol    States she vomited times 1

## 2016-07-16 NOTE — Discharge Instructions (Signed)
Please alternate Tylenol and/or ibuprofen as needed for headaches. Follow-up with your primary care physician in 2-3 days. Return to the ER for any worsening symptoms urgent changes in her health.

## 2016-07-16 NOTE — ED Provider Notes (Signed)
[facility]-EMERGENCY DEPARTMENT Provider Note   CSN: [id number] Arrival date & time: 07/16/16  1658     History   Chief Complaint Chief Complaint  Patient presents with  . Headache    HPI Tammy [name] is a 26 y.o. female presents to the emergency department for evaluation of headache. Headache has been present for 1-2 weeks. No trauma or injury. Headache is moderate and throbbing. The headache comes and goes. She denies any vision changes, fevers, neck pain, numbness tingling or radicular symptoms. Moderate improvement with BC powders.  HPI  Patient Active Problem List   Diagnosis Date Noted  . Anxiety 03/29/2016  . S/P cesarean section 02/16/2016  . Marijuana use 07/26/2015    Past Surgical History:  Procedure Laterality Date  . CESAREAN SECTION  09/30/2013  . CESAREAN SECTION N/A 02/16/2016   Procedure: REPEAT CESAREAN SECTION;  Surgeon: Hildred Laser, MD;  Location: [facility] ORS;  Service: Obstetrics;  Laterality: N/A;  Female @ 1435 Wt: 6lb 13oz Apgars: 8/9  . PEG TUBE PLACEMENT Bilateral 2011  . TONSILLECTOMY  2011    OB History    Gravida Para Term Preterm AB Living   3 1 1   1 1    SAB TAB Ectopic Multiple Live Births   1     0 1       Home Medications    Prior to Admission medications   Medication Sig Start Date End Date Taking? Authorizing Provider  ALPRAZolam Prudy Feeler) 1 MG tablet Take 1 tablet (1 mg total) by mouth at bedtime as needed for anxiety. 04/27/16   Melody N Shambley, CNM  buprenorphine (SUBUTEX) 8 MG SUBL SL tablet Place 8 mg under the tongue daily.    Historical Provider, MD  medroxyPROGESTERone (DEPO-PROVERA) 150 MG/ML injection Inject 1 mL (150 mg total) into the muscle every 3 (three) months. 03/01/16   Melody Suzan Nailer, CNM  Prenatal Vit-Fe Fumarate-FA (PRENATAL MULTIVITAMIN) TABS tablet Take 1 tablet by mouth daily at 12 noon. Reported on 08/25/2015    Historical Provider, MD    Family History Family History  Problem Relation Age of Onset    . Cancer Maternal Grandmother     thyroid    Social History Social History  Substance Use Topics  . Smoking status: Never Smoker  . Smokeless tobacco: Never Used  . Alcohol use No     Allergies   Hydrocodone-acetaminophen; Morphine and related; Other; Peanuts [peanut oil]; and Penicillins   Review of Systems Review of Systems  Constitutional: Negative for activity change, chills, fatigue and fever.  HENT: Negative for congestion, sinus pressure and sore throat.   Eyes: Negative for visual disturbance.  Respiratory: Negative for cough, chest tightness and shortness of breath.   Cardiovascular: Negative for chest pain and leg swelling.  Gastrointestinal: Negative for abdominal pain, diarrhea, nausea and vomiting.  Genitourinary: Negative for dysuria.  Musculoskeletal: Negative for arthralgias and gait problem.  Skin: Negative for rash.  Neurological: Positive for headaches. Negative for dizziness, syncope, speech difficulty, weakness and numbness.  Hematological: Negative for adenopathy.  Psychiatric/Behavioral: Negative for agitation, behavioral problems and confusion.     Physical Exam Updated Vital Signs There were no vitals taken for this visit.  Physical Exam  Constitutional: She is oriented to person, place, and time. She appears well-developed and well-nourished. No distress.  HENT:  Head: Normocephalic and atraumatic.  Eyes: Conjunctivae are normal.  Neck: Neck supple.  Cardiovascular: Normal rate and regular rhythm.   No murmur heard. Pulmonary/Chest: Effort normal  and breath sounds normal. No respiratory distress.  Abdominal: Soft. There is no tenderness.  Musculoskeletal: She exhibits no edema.  Neurological: She is alert and oriented to person, place, and time. No cranial nerve deficit. She exhibits normal muscle tone. Coordination normal.  Skin: Skin is warm and dry.  Psychiatric: She has a normal mood and affect.  Nursing note and vitals  reviewed.    ED Treatments / Results  Labs (all labs ordered are listed, but only abnormal results are displayed) Labs Reviewed - No data to display  EKG  EKG Interpretation None       Radiology No results found.  Procedures Procedures (including critical care time)  Medications Ordered in ED Medications  ketorolac (TORADOL) injection 30 mg (30 mg Intramuscular Given 07/16/16 1810)  promethazine (PHENERGAN) injection 12.5 mg (12.5 mg Intramuscular Given 07/16/16 1811)     Initial Impression / Assessment and Plan / ED Course  I have reviewed the triage vital signs and the nursing notes.  Pertinent labs & imaging results that were available during my care of the patient were reviewed by me and considered in my medical decision making (see chart for details).    26 year old female with headache, no trauma or injury. No signs of distress. Patient given Toradol 30 mg IM, Phenergan 12.5 mg IM. Headache resolved. She is educated on signs and symptoms return to the emergency department for. Recommend following up with PCP.  Final Clinical Impressions(s) / ED Diagnoses   Final diagnoses:  Acute nonintractable headache, unspecified headache type    New Prescriptions New Prescriptions   No medications on file     Evon Slackhomas C Gaines, PA-C 07/16/16 1922    Arnaldo NatalPaul F Malinda, MD 07/16/16 [phone]

## 2016-10-08 ENCOUNTER — Emergency Department
Admission: EM | Admit: 2016-10-08 | Discharge: 2016-10-08 | Disposition: A | Discharge status: Home / Self Care | Payer: Medicaid Other | Attending: Emergency Medicine | Admitting: Emergency Medicine

## 2016-10-08 ENCOUNTER — Emergency Department: Payer: Medicaid Other

## 2016-10-08 DIAGNOSIS — J45909 Unspecified asthma, uncomplicated: Secondary | ICD-10-CM | POA: Diagnosis not present

## 2016-10-08 DIAGNOSIS — M79645 Pain in left finger(s): Secondary | ICD-10-CM

## 2016-10-08 DIAGNOSIS — Y999 Unspecified external cause status: Secondary | ICD-10-CM | POA: Insufficient documentation

## 2016-10-08 DIAGNOSIS — S6992XA Unspecified injury of left wrist, hand and finger(s), initial encounter: Secondary | ICD-10-CM | POA: Diagnosis present

## 2016-10-08 DIAGNOSIS — Z79899 Other long term (current) drug therapy: Secondary | ICD-10-CM | POA: Diagnosis not present

## 2016-10-08 DIAGNOSIS — Z23 Encounter for immunization: Secondary | ICD-10-CM | POA: Insufficient documentation

## 2016-10-08 DIAGNOSIS — S60312A Abrasion of left thumb, initial encounter: Secondary | ICD-10-CM | POA: Insufficient documentation

## 2016-10-08 DIAGNOSIS — Y939 Activity, unspecified: Secondary | ICD-10-CM | POA: Insufficient documentation

## 2016-10-08 DIAGNOSIS — Y929 Unspecified place or not applicable: Secondary | ICD-10-CM | POA: Diagnosis not present

## 2016-10-08 DIAGNOSIS — W503XXA Accidental bite by another person, initial encounter: Secondary | ICD-10-CM

## 2016-10-08 MED ORDER — TETANUS-DIPHTH-ACELL PERTUSSIS 5-2.5-18.5 LF-MCG/0.5 IM SUSP
0.5000 mL | Freq: Once | INTRAMUSCULAR | Status: AC
  Administered 2016-10-08: 0.5 mL via INTRAMUSCULAR
  Filled 2016-10-08: qty 0.5

## 2016-10-08 NOTE — ED Notes (Addendum)
Pt stated that she was leaving, that she would allow this RN to apply splint and give tetanus shot, but declined to stay for discharge instructions or any prescriptions she might be given.  Pt verbalized understanding, but stated that her ride needed to go.  Informed pt that discharge paperwork would be at front desk if she wished to return.  Pt refused vitals.  Pt resp even and unlabored, ambulatory w/o issue. NAD
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
## 2016-10-08 NOTE — ED Notes (Signed)
Pt out wandering in hallway and to nursing station asking how much longer until she is discharged.  Explained to her PA in another room, that this RN would communicate her desire to be discharged once her PA was available

## 2018-03-06 ENCOUNTER — Other Ambulatory Visit: Payer: Self-pay

## 2018-03-06 ENCOUNTER — Encounter (HOSPITAL_COMMUNITY): Payer: Self-pay | Admitting: *Deleted

## 2018-03-06 DIAGNOSIS — Z91010 Allergy to peanuts: Secondary | ICD-10-CM | POA: Diagnosis not present

## 2018-03-06 DIAGNOSIS — R109 Unspecified abdominal pain: Secondary | ICD-10-CM | POA: Diagnosis present

## 2018-03-06 DIAGNOSIS — Z79899 Other long term (current) drug therapy: Secondary | ICD-10-CM | POA: Diagnosis not present

## 2018-03-06 DIAGNOSIS — Z3202 Encounter for pregnancy test, result negative: Secondary | ICD-10-CM | POA: Insufficient documentation

## 2018-03-06 DIAGNOSIS — J45909 Unspecified asthma, uncomplicated: Secondary | ICD-10-CM | POA: Diagnosis not present

## 2018-03-06 DIAGNOSIS — N912 Amenorrhea, unspecified: Secondary | ICD-10-CM | POA: Diagnosis not present

## 2018-03-06 NOTE — ED Triage Notes (Signed)
Pt states she has taken multiple pregnancy tests and a couple have said positive and a couple have been negative; pt just wants to know if she is pregnant

## 2018-03-07 ENCOUNTER — Emergency Department (HOSPITAL_COMMUNITY)
Admission: EM | Admit: 2018-03-07 | Discharge: 2018-03-07 | Disposition: A | Discharge status: Home / Self Care | Payer: Medicaid Other | Attending: Emergency Medicine | Admitting: Emergency Medicine

## 2018-03-07 DIAGNOSIS — N912 Amenorrhea, unspecified: Secondary | ICD-10-CM

## 2018-03-07 LAB — URINALYSIS, ROUTINE W REFLEX MICROSCOPIC
Bilirubin Urine: NEGATIVE
Glucose, UA: NEGATIVE mg/dL
Hgb urine dipstick: NEGATIVE
Ketones, ur: NEGATIVE mg/dL
Leukocytes, UA: NEGATIVE
Nitrite: NEGATIVE
Protein, ur: NEGATIVE mg/dL
Specific Gravity, Urine: 1.021 (ref 1.005–1.030)
pH: 5 (ref 5.0–8.0)

## 2018-03-07 LAB — PREGNANCY, URINE: Preg Test, Ur: NEGATIVE

## 2018-03-07 NOTE — ED Provider Notes (Signed)
[facility] EMERGENCY DEPARTMENT Provider Note   CSN: [id number] Arrival date & time: 03/06/18  2300     History   Chief Complaint Chief Complaint  Patient presents with  . Possible Pregnancy    HPI Tammy [name] is a 27 y.o. female.  The history is provided by the patient. No language interpreter was used.  Possible Pregnancy  This is a new problem. The current episode started more than 1 week ago. The problem occurs constantly. The problem has not changed since onset.Associated symptoms include abdominal pain. Nothing aggravates the symptoms. Nothing relieves the symptoms. She has tried nothing for the symptoms.   Pt reports she feels like she did when she was pregnant in the past.  Pt reports she had a depo shot 2 months ago.   Past Medical History:  Diagnosis Date  . ADHD (attention deficit hyperactivity disorder)   . Anxiety   . Asthma   . Depression   . Herpes genitalia   . History of chlamydia   . Substance abuse [facility])     Patient Active Problem List   Diagnosis Date Noted  . Anxiety 03/29/2016  . S/P cesarean section 02/16/2016  . Marijuana use 07/26/2015    Past Surgical History:  Procedure Laterality Date  . CESAREAN SECTION  09/30/2013  . CESAREAN SECTION N/A 02/16/2016   Procedure: REPEAT CESAREAN SECTION;  Surgeon: Hildred Laser, MD;  Location: [facility] ORS;  Service: Obstetrics;  Laterality: N/A;  Female @ 1435 Wt: 6lb 13oz Apgars: 8/9  . PEG TUBE PLACEMENT Bilateral 2011  . TONSILLECTOMY  2011     OB History    Gravida  3   Para  1   Term  1   Preterm      AB  1   Living  1     SAB  1   TAB      Ectopic      Multiple  0   Live Births  1            Home Medications    Prior to Admission medications   Medication Sig Start Date End Date Taking? Authorizing Provider  ALPRAZolam Prudy Feeler) 1 MG tablet Take 1 tablet (1 mg total) by mouth at bedtime as needed for anxiety. 04/27/16   Shambley, Melody N, CNM  buprenorphine  (SUBUTEX) 8 MG SUBL SL tablet Place 8 mg under the tongue daily.    [provider]  medroxyPROGESTERone (DEPO-PROVERA) 150 MG/ML injection Inject 1 mL (150 mg total) into the muscle every 3 (three) months. 03/01/16   Shambley, Melody N, CNM  Prenatal Vit-Fe Fumarate-FA (PRENATAL MULTIVITAMIN) TABS tablet Take 1 tablet by mouth daily at 12 noon. Reported on 08/25/2015    [provider]    Family History Family History  Problem Relation Age of Onset  . Cancer Maternal Grandmother        thyroid    Social History Social History   Tobacco Use  . Smoking status: Never Smoker  . Smokeless tobacco: Never Used  Substance Use Topics  . Alcohol use: No  . Drug use: No     Allergies   Hydrocodone-acetaminophen; Morphine and related; Other; Peanuts [peanut oil]; and Penicillins   Review of Systems Review of Systems  Gastrointestinal: Positive for abdominal pain.  All other systems reviewed and are negative.    Physical Exam Updated Vital Signs BP 129/67 (BP Location: Right Arm)   Pulse 72   Temp 98 F (36.7 C) (Oral)  Resp 15   Ht 5\' 4"  (1.626 m)   Wt 68.9 kg   SpO2 100%   BMI 26.09 kg/m   Physical Exam  Constitutional: She appears well-developed and well-nourished. No distress.  HENT:  Head: Normocephalic and atraumatic.  Eyes: Conjunctivae are normal.  Neck: Neck supple.  Cardiovascular: Normal rate and regular rhythm.  No murmur heard. Pulmonary/Chest: Effort normal and breath sounds normal. No respiratory distress.  Abdominal: Soft. There is no tenderness.  Musculoskeletal: She exhibits no edema.  Neurological: She is alert.  Skin: Skin is warm and dry.  Psychiatric: She has a normal mood and affect.  Nursing note and vitals reviewed.    ED Treatments / Results  Labs (all labs ordered are listed, but only abnormal results are displayed) Labs Reviewed  PREGNANCY, URINE  URINALYSIS, ROUTINE W REFLEX MICROSCOPIC     EKG None  Radiology No results found. An After Visit Summary was printed and given to the patient.  Procedures Procedures (including critical care time)  Medications Ordered in ED Medications - No data to display   Initial Impression / Assessment and Plan / ED Course  I have reviewed the triage vital signs and the nursing notes.  Pertinent labs & imaging results that were available during my care of the patient were reviewed by me and considered in my medical decision making (see chart for details).     Ua and urine pregnancy are negative.  I advised pt to repeat a pregnancy test in 1 week  Follow up with primary care provider for recheck  Final Clinical Impressions(s) / ED Diagnoses   Final diagnoses:  Amenorrhea    ED Discharge Orders    None       Elson Areas, [place] 03/07/18 0046    Dione Booze, MD 03/07/18 [phone]

## 2018-03-07 NOTE — Discharge Instructions (Signed)
Repeat a pregnancy test in 1 week.  Schedule to see your Physician for recheck

## 2018-11-03 ENCOUNTER — Emergency Department
Admission: EM | Admit: 2018-11-03 | Discharge: 2018-11-03 | Disposition: A | Discharge status: Home / Self Care | Payer: Medicaid Other | Attending: Emergency Medicine | Admitting: Emergency Medicine

## 2018-11-03 ENCOUNTER — Encounter: Payer: Self-pay | Admitting: Emergency Medicine

## 2018-11-03 ENCOUNTER — Other Ambulatory Visit: Payer: Self-pay

## 2018-11-03 DIAGNOSIS — Z3A00 Weeks of gestation of pregnancy not specified: Secondary | ICD-10-CM | POA: Diagnosis not present

## 2018-11-03 DIAGNOSIS — Z91010 Allergy to peanuts: Secondary | ICD-10-CM | POA: Insufficient documentation

## 2018-11-03 DIAGNOSIS — R1031 Right lower quadrant pain: Secondary | ICD-10-CM | POA: Diagnosis not present

## 2018-11-03 DIAGNOSIS — Z79899 Other long term (current) drug therapy: Secondary | ICD-10-CM | POA: Insufficient documentation

## 2018-11-03 DIAGNOSIS — J45909 Unspecified asthma, uncomplicated: Secondary | ICD-10-CM | POA: Insufficient documentation

## 2018-11-03 DIAGNOSIS — Z3201 Encounter for pregnancy test, result positive: Secondary | ICD-10-CM | POA: Diagnosis not present

## 2018-11-03 DIAGNOSIS — O26891 Other specified pregnancy related conditions, first trimester: Secondary | ICD-10-CM | POA: Insufficient documentation

## 2018-11-03 DIAGNOSIS — Z3490 Encounter for supervision of normal pregnancy, unspecified, unspecified trimester: Secondary | ICD-10-CM

## 2018-11-03 LAB — COMPREHENSIVE METABOLIC PANEL
ALT: 13 U/L (ref 0–44)
AST: 19 U/L (ref 15–41)
Albumin: 4.1 g/dL (ref 3.5–5.0)
Alkaline Phosphatase: 66 U/L (ref 38–126)
Anion gap: 8 (ref 5–15)
BUN: 10 mg/dL (ref 6–20)
CO2: 25 mmol/L (ref 22–32)
Calcium: 8.8 mg/dL — ABNORMAL LOW (ref 8.9–10.3)
Chloride: 103 mmol/L (ref 98–111)
Creatinine, Ser: 0.93 mg/dL (ref 0.44–1.00)
GFR calc Af Amer: >60 mL/min (ref >60)
GFR calc non Af Amer: >60 mL/min (ref >60)
Glucose, Bld: 89 mg/dL (ref 70–99)
Potassium: 3.7 mmol/L (ref 3.5–5.1)
Sodium: 136 mmol/L (ref 135–145)
Total Bilirubin: 0.4 mg/dL (ref 0.3–1.2)
Total Protein: 6.7 g/dL (ref 6.5–8.1)

## 2018-11-03 LAB — URINALYSIS, COMPLETE (UACMP) WITH MICROSCOPIC
Bacteria, UA: NONE SEEN
Bilirubin Urine: NEGATIVE
Glucose, UA: NEGATIVE mg/dL
Hgb urine dipstick: NEGATIVE
Ketones, ur: NEGATIVE mg/dL
Leukocytes,Ua: NEGATIVE
Nitrite: NEGATIVE
Protein, ur: NEGATIVE mg/dL
Specific Gravity, Urine: 1.004 — ABNORMAL LOW (ref 1.005–1.030)
pH: 7 (ref 5.0–8.0)

## 2018-11-03 LAB — CBC
HCT: 37.6 % (ref 36.0–46.0)
Hemoglobin: 12.9 g/dL (ref 12.0–15.0)
MCH: 30.1 pg (ref 26.0–34.0)
MCHC: 34.3 g/dL (ref 30.0–36.0)
MCV: 87.6 fL (ref 80.0–100.0)
Platelets: 226 10*3/uL (ref 150–400)
RBC: 4.29 MIL/uL (ref 3.87–5.11)
RDW: 12.5 % (ref 11.5–15.5)
WBC: 5.3 10*3/uL (ref 4.0–10.5)
nRBC: 0 % (ref 0.0–0.2)

## 2018-11-03 LAB — HCG, QUANTITATIVE, PREGNANCY: hCG, Beta Chain, Quant, S: 12806 m[IU]/mL — ABNORMAL HIGH (ref <5)

## 2018-11-03 LAB — LIPASE, BLOOD: Lipase: 27 U/L (ref 11–51)

## 2018-11-03 MED ORDER — SODIUM CHLORIDE 0.9% FLUSH: 3.0000 mL | Freq: Once | INTRAVENOUS | Status: DC

## 2018-11-03 NOTE — Discharge Instructions (Addendum)
Please seek medical attention for any high fevers, chest pain, shortness of breath, change in behavior, persistent vomiting, bloody stool or any other new or concerning symptoms.  

## 2018-11-03 NOTE — ED Triage Notes (Signed)
States lower abdominal pain x 3 days. States was seen at health department 3 days ago and they thought she was about [redacted] weeks pregnant.

## 2018-11-03 NOTE — ED Provider Notes (Signed)
[facility] Emergency Department Provider Note  ____________________________________________   I have reviewed the triage vital signs and the nursing notes.   HISTORY  Chief Complaint Abdominal Pain and Possible Pregnancy   History limited by: Not Limited   HPI Tammy [name] is a 28 y.o. female who presents to the emergency department today because of concerns for abdominal cramping the setting of recent positive pregnancy test.  Patient states that she was having some abdominal pain in her right and left lower abdomen.  She described it as cramping.  By the time my examination the patient states that the cramping had resolved.  The patient denied any vaginal bleeding or abnormal discharge.  Denied any nausea or vomiting.  No fevers.  No change in urine or bowel movements.   Records reviewed. Per medical record review patient has a history of cesarean section  Past Medical History:  Diagnosis Date  . ADHD (attention deficit hyperactivity disorder)   . Anxiety   . Asthma   . Depression   . Herpes genitalia   . History of chlamydia   . Substance abuse [facility])     Patient Active Problem List   Diagnosis Date Noted  . Anxiety 03/29/2016  . S/P cesarean section 02/16/2016  . Marijuana use 07/26/2015    Past Surgical History:  Procedure Laterality Date  . CESAREAN SECTION  09/30/2013  . CESAREAN SECTION N/A 02/16/2016   Procedure: REPEAT CESAREAN SECTION;  Surgeon: Hildred LaserAnika Cherry, MD;  Location: [facility] ORS;  Service: Obstetrics;  Laterality: N/A;  Female @ 1435 Wt: 6lb 13oz Apgars: 8/9  . PEG TUBE PLACEMENT Bilateral 2011  . TONSILLECTOMY  2011    Prior to Admission medications   Medication Sig Start Date End Date Taking? Authorizing Provider  ALPRAZolam Prudy Feeler(XANAX) 1 MG tablet Take 1 tablet (1 mg total) by mouth at bedtime as needed for anxiety. 04/27/16   Shambley, Melody N, CNM  buprenorphine (SUBUTEX) 8 MG SUBL SL tablet Place 8 mg under the tongue  daily.    [provider]  medroxyPROGESTERone (DEPO-PROVERA) 150 MG/ML injection Inject 1 mL (150 mg total) into the muscle every 3 (three) months. 03/01/16   Shambley, Melody N, CNM  Prenatal Vit-Fe Fumarate-FA (PRENATAL MULTIVITAMIN) TABS tablet Take 1 tablet by mouth daily at 12 noon. Reported on 08/25/2015    [provider]    Allergies Hydrocodone-acetaminophen, Morphine and related, Other, Peanuts [peanut oil], and Penicillins  Family History  Problem Relation Age of Onset  . Cancer Maternal Grandmother        thyroid    Social History Social History   Tobacco Use  . Smoking status: Never Smoker  . Smokeless tobacco: Never Used  Substance Use Topics  . Alcohol use: No  . Drug use: No    Review of Systems Constitutional: No fever/chills Eyes: No visual changes. ENT: No sore throat. Cardiovascular: Denies chest pain. Respiratory: Denies shortness of breath. Gastrointestinal: Positive for abdominal cramping Genitourinary: Negative for dysuria. Musculoskeletal: Negative for back pain. Skin: Negative for rash. Neurological: Negative for headaches, focal weakness or numbness.  ____________________________________________   PHYSICAL EXAM:  VITAL SIGNS: ED Triage Vitals  Enc Vitals Group     BP 11/03/18 1804 128/74     Pulse Rate 11/03/18 1804 70     Resp 11/03/18 1804 18     Temp 11/03/18 1804 97.8 F (36.6 C)     Temp Source 11/03/18 1804 Oral     SpO2 11/03/18 1804 97 %  Weight --      Height --      Head Circumference --      Peak Flow --      Pain Score 11/03/18 1807 5   Constitutional: Alert and oriented.  Eyes: Conjunctivae are normal.  ENT      Head: Normocephalic and atraumatic.      Nose: No congestion/rhinnorhea.      Mouth/Throat: Mucous membranes are moist.      Neck: No stridor. Hematological/Lymphatic/Immunilogical: No cervical lymphadenopathy. Cardiovascular: Normal rate, regular rhythm.  No murmurs, rubs, or  gallops.  Respiratory: Normal respiratory effort without tachypnea nor retractions. Breath sounds are clear and equal bilaterally. No wheezes/rales/rhonchi. Gastrointestinal: Soft and non tender. No rebound. No guarding.  Genitourinary: Deferred Musculoskeletal: Normal range of motion in all extremities. No lower extremity edema. Neurologic:  Normal speech and language. No gross focal neurologic deficits are appreciated.  Skin:  Skin is warm, dry and intact. No rash noted. Psychiatric: Mood and affect are normal. Speech and behavior are normal. Patient exhibits appropriate insight and judgment.  ____________________________________________    LABS (pertinent positives/negatives)  hcg 12806 Lipase 27 CMP wnl except ca 8.8 CBC wnl UA not consistent with infection   ____________________________________________   EKG  None  ____________________________________________    RADIOLOGY  None  ____________________________________________   PROCEDURES  Procedures  ____________________________________________   INITIAL IMPRESSION / ASSESSMENT AND PLAN / ED COURSE  Pertinent labs & imaging results that were available during my care of the patient were reviewed by me and considered in my medical decision making (see chart for details).   Patient presented to the emergency department today because of concerns for abdominal cramping in the setting of early pregnancy.  Patient's hCG was positive here.  Bedside ultrasound shows an intrauterine gestational sac.  Patient's abdomen was benign.  Did discuss with patient location of pregnancy.  Will have patient follow-up with OB/GYN. Discussed prenatal vitamins.    ____________________________________________   FINAL CLINICAL IMPRESSION(S) / ED DIAGNOSES  Final diagnoses:  Pregnancy, unspecified gestational age     Note: This dictation was prepared with Diplomatic Services operational officer dictation. Any transcriptional errors that result from this process  are unintentional     Nance Pear, MD 11/03/18 2241

## 2018-11-18 ENCOUNTER — Other Ambulatory Visit (HOSPITAL_COMMUNITY)
Admission: RE | Admit: 2018-11-18 | Discharge: 2018-11-18 | Disposition: A | Discharge status: Home / Self Care | Payer: Medicaid Other | Source: Ambulatory Visit | Attending: Maternal Newborn | Admitting: Maternal Newborn

## 2018-11-18 ENCOUNTER — Encounter: Payer: Self-pay | Admitting: Maternal Newborn

## 2018-11-18 ENCOUNTER — Other Ambulatory Visit: Payer: Self-pay

## 2018-11-18 ENCOUNTER — Ambulatory Visit (INDEPENDENT_AMBULATORY_CARE_PROVIDER_SITE_OTHER): Payer: Medicaid Other | Admitting: Maternal Newborn

## 2018-11-18 VITALS — BP 120/80 | Wt 166.0 lb

## 2018-11-18 DIAGNOSIS — Z369 Encounter for antenatal screening, unspecified: Secondary | ICD-10-CM

## 2018-11-18 DIAGNOSIS — Z113 Encounter for screening for infections with a predominantly sexual mode of transmission: Secondary | ICD-10-CM | POA: Insufficient documentation

## 2018-11-18 DIAGNOSIS — Z124 Encounter for screening for malignant neoplasm of cervix: Secondary | ICD-10-CM

## 2018-11-18 DIAGNOSIS — Z3201 Encounter for pregnancy test, result positive: Secondary | ICD-10-CM

## 2018-11-18 DIAGNOSIS — Z3481 Encounter for supervision of other normal pregnancy, first trimester: Secondary | ICD-10-CM

## 2018-11-18 DIAGNOSIS — O0990 Supervision of high risk pregnancy, unspecified, unspecified trimester: Secondary | ICD-10-CM | POA: Insufficient documentation

## 2018-11-18 LAB — POCT URINALYSIS DIPSTICK OB: Glucose, UA: NEGATIVE

## 2018-11-18 LAB — POCT URINE PREGNANCY: Preg Test, Ur: POSITIVE — AB

## 2018-11-18 NOTE — Progress Notes (Signed)
11/18/2018   Chief Complaint: Desires prenatal care.  Transfer of Care Patient: no  History of Present Illness: Ms. Tammy [name] is a 28 y.o. [id number];  no LMP recorded (lmp unknown), with the above CC.   Her periods were: irregular periods; does not recall having a cycle in the past months She has Negative signs or symptoms of nausea/vomiting of pregnancy. She has Negative signs or symptoms of miscarriage or preterm labor She identifies Negative Zika risk factors for her and her partner On any different medications around the time she conceived/early pregnancy: Yes , currently taking Subutex, prenatal vitamins; denies taking any other medication or substances History of varicella: Yes   Review of Systems  Constitutional: Negative.   HENT: Negative.   Eyes: Negative.   Respiratory: Negative for shortness of breath and wheezing.   Cardiovascular: Negative for chest pain and palpitations.  Gastrointestinal: Negative for abdominal pain, nausea and vomiting.  Genitourinary: Negative.   Musculoskeletal: Negative.   Skin: Negative.   Neurological: Negative.   Endo/Heme/Allergies: Negative.   Psychiatric/Behavioral: Negative.    Review of systems was otherwise negative, except as stated in the above HPI.  OBGYN History: As per HPI. OB History  Gravida Para Term Preterm AB Living  4 2 2   1 2   SAB TAB Ectopic Multiple Live Births  1     0 2    # Outcome Date GA Lbr Len/2nd Weight Sex Delivery Anes PTL Lv  4 Current           3 Term 02/16/16 [id number]  6 lb 12.6 oz (3.08 kg) F CS-LTranv   LIV  2 Term 09/30/13    F CS-Unspec   LIV  1 SAB 2014            Any issues with any prior pregnancies: SAB with G1. States no complications with other pregnancies; Cesarean with G2 was an emergent surgery but she does not know the indication.  Any prior children are healthy, doing well, without any problems or issues: yes History of pap smears: Yes. Last pap smear: Unknown History of STIs: Patient  denies, history review shows genital herpes/chlamydia in the past   Past Medical History: Past Medical History:  Diagnosis Date  . ADHD (attention deficit hyperactivity disorder)   . Anxiety   . Asthma   . Depression   . Herpes genitalia   . History of chlamydia   . Substance abuse [facility](HCC)     Past Surgical History: Past Surgical History:  Procedure Laterality Date  . CESAREAN SECTION  09/30/2013  . CESAREAN SECTION N/A 02/16/2016   Procedure: REPEAT CESAREAN SECTION;  Surgeon: Hildred LaserAnika Cherry, MD;  Location: [facility] ORS;  Service: Obstetrics;  Laterality: N/A;  Female @ 1435 Wt: 6lb 13oz Apgars: 8/9  . PEG TUBE PLACEMENT Bilateral 2011  . TONSILLECTOMY  2011    Family History:  Family History  Problem Relation Age of Onset  . Cancer Maternal Grandmother        thyroid    She denies any female cancers, bleeding or blood clotting disorders.  She denies any history of intellectual disability, birth defects or genetic disorders in her or the FOB's history  Social History:  Social History   Socioeconomic History  . Marital status: Single    Spouse name: Not on file  . Number of children: Not on file  . Years of education: Not on file  . Highest education level: Not on file  Occupational History  . Not on file  Social Needs  . Financial resource strain: Not on file  . Food insecurity    Worry: Not on file    Inability: Not on file  . Transportation needs    Medical: Not on file    Non-medical: Not on file  Tobacco Use  . Smoking status: Never Smoker  . Smokeless tobacco: Never Used  Substance and Sexual Activity  . Alcohol use: No  . Drug use: No  . Sexual activity: Yes    Birth control/protection: Injection  Lifestyle  . Physical activity    Days per week: Not on file    Minutes per session: Not on file  . Stress: Not on file  Relationships  . Social Herbalist on phone: Not on file    Gets together: Not on file    Attends religious service: Not on  file    Active member of club or organization: Not on file    Attends meetings of clubs or organizations: Not on file    Relationship status: Not on file  . Intimate partner violence    Fear of current or ex partner: Not on file    Emotionally abused: Not on file    Physically abused: Not on file    Forced sexual activity: Not on file  Other Topics Concern  . Not on file  Social History Narrative  . Not on file   Any cats in the household: no Domestic violence screening is negative  Allergy: Allergies  Allergen Reactions  . Hydrocodone-Acetaminophen Itching  . Morphine And Related Itching  . Other Swelling    SUBOXONE AND NALOXONE    . Peanuts [Peanut Oil] Swelling  . Penicillins Hives    Current Outpatient Medications:  Current Outpatient Medications:  .  buprenorphine (SUBUTEX) 8 MG SUBL SL tablet, Place 8 mg under the tongue daily., Disp: , Rfl:  .  Prenatal Vit-Fe Fumarate-FA (PRENATAL MULTIVITAMIN) TABS tablet, Take 1 tablet by mouth daily at 12 noon. Reported on 08/25/2015, Disp: , Rfl:  .  ALPRAZolam (XANAX) 1 MG tablet, Take 1 tablet (1 mg total) by mouth at bedtime as needed for anxiety. (Patient not taking: Reported on 11/18/2018), Disp: 30 tablet, Rfl: 0 .  medroxyPROGESTERone (DEPO-PROVERA) 150 MG/ML injection, Inject 1 mL (150 mg total) into the muscle every 3 (three) months. (Patient not taking: Reported on 11/18/2018), Disp: 1 mL, Rfl: 4   Physical Exam:   BP 120/80   Wt 166 lb (75.3 kg)   LMP  (LMP Unknown)   BMI 28.49 kg/m  Body mass index is 28.49 kg/m. Constitutional: Well nourished, well developed female in no acute distress.  Neck:  Supple, normal appearance, and no thyromegaly  Cardiovascular: S1, S2 normal, no murmur, rub or gallop, regular rate and rhythm Respiratory:  Clear to auscultation bilaterally. Normal respiratory effort Abdomen: no masses, hernias; diffusely non tender to palpation, non distended Breasts: patient declines to have breast  exam. Neuro/Psych:  Normal mood and affect.  Skin:  Warm and dry.  Lymphatic:  No inguinal lymphadenopathy.   Pelvic exam: is not limited by body habitus External genitalia, Bartholin's glands, Urethra, Skene's glands: within normal limits Vagina: within normal limits and with no blood in the vault  Cervix: normal appearing cervix without discharge or lesions  Uterus:  Enlarged consistent with pregnancy, normal contour Adnexa:  not evaluated  Assessment: Ms. Tammy [name] is a 28 y.o. [id number],  no LMP recorded (lmp unknown), presenting for prenatal care.  Plan:  1)  Avoid alcoholic beverages. 2) Patient encouraged not to smoke.  3) Discontinue the use of all non-medicinal drugs and chemicals.  4) Take prenatal vitamins daily.  5) Seatbelt use advised 6) Nutrition, food safety (fish, cheese advisories, and high nitrite foods) discussed. 7) Hospital and [facility] discussed  8) Patient is asked about travel to areas at risk for the Zika virus, and counseled to avoid travel and exposure to mosquitoes or partners who may have themselves been exposed to the virus.  9) Genetic Screening is discussed with patient. She plans to declinegenetic testing this pregnancy. 10) Was seen in ED on 11/03/2018 and had a bedside ultrasound which showed an intrauterine gestational sac. Ordered dating/viability scan.  Problem list reviewed and updated.  Marcelyn BruinsJacelyn Schmid, CNM [facility], [facility] 11/18/2018  1:58 PM

## 2018-11-18 NOTE — Patient Instructions (Signed)
Pregnancy and COVID-19 Coronavirus disease, also called COVID-19, is an infection of the lungs and airways (respiratory tract). It is unclear at this time if pregnancy makes it more likely for you to get COVID-19, or what effects the infection may have on your unborn baby. However, pregnancy causes changes to your heart, lungs, and the body's disease-fighting system (immune system). Some of these changes make it more likely for you to get sick and have more serious illness. Therefore, it is important for you to take precautions in order to protect yourself and your unborn baby. Work with your health care team to develop a plan to protect yourself from all infections, including COVID-19. This is one way for you to stay healthy during your pregnancy and to keep your baby healthy as well. How does this affect me? If you get COVID-19, there is a risk that you may:  Get a respiratory illness that can lead to pneumonia.  Give birth to your baby before 37 weeks of pregnancy (premature birth). If you have or may have COVID-19, your health care provider may recommend special precautions around your pregnancy. This may affect how you:  Receive care before delivery (prenatal care). How you visit your health care provider may change. Tests and scans may need to be performed differently.  Receive care during labor and delivery. This may affect your birth plan, including who may be with you during labor and delivery.  Receive care after you deliver your baby (postpartum care). You may stay longer in the hospital, and in a special room. Your baby may also need to stay away from you.  Feed your baby after he or she is born. Pregnancy can be an especially stressful time because of the changes in your body and the preparation involved in becoming a parent. In addition, you may be feeling especially fearful, anxious, or stressed because of COVID-19 and how it is affecting you. How does this affect my baby? It is  not known whether a mother will transmit the virus to her unborn baby. There is a risk that if you get COVID-19:  The virus that causes COVID-19 can pass to your baby.  You may have premature birth. Your baby may require more medical care if this happens. What can I do to lower my risk?  There is no vaccine to help prevent COVID-19. However, there are actions that you can take to protect yourself and others from this virus. Cleaning and personal hygiene  Wash your hands often with soap and water for at least 20 seconds. If soap and water are not available, use alcohol-based hand sanitizer.  Avoid touching your mouth, face, eyes, or nose.  Clean and disinfect objects and surfaces that are frequently touched every day. This may include: ? Counters and tables. ? Doorknobs and light switches. ? Sinks and faucets. ? Electronics such as phones, remote controls, keyboards, computers, and tablets. Stay away from others  Stay away from people who are sick, if possible.  Avoid social gatherings and travel.  Stay home as much as possible. Follow these instructions: Breastfeeding It is not known if the virus that causes COVID-19 can pass through breast milk to your baby. You should make a plan for feeding your infant with your family and your health care team. If you have or may have COVID-19, your health care provider may recommend that you take precautions while breastfeeding, such as:  Washing your hands before feeding your baby.  Wearing a mask while feeding your  baby.  Pumping or expressing breast milk to feed to your baby. If possible, ask someone in your household who is not sick to feed your baby the expressed breast milk. ? Wash your hands before touching pump parts. ? Wash and disinfect all pump parts after expressing milk. Follow the manufacturer's instructions to clean and disinfect all pump parts. General instructions  If you think you have a COVID-19 infection, contact your  health care provider right away. Tell your health care provider that you think you may have a COVID-19 infection.  Follow your health care provider's instructions on taking medicines. Some medicines may be unsafe to take during pregnancy.  Cover your mouth and nose by wearing a mask or other cloth covering over your face when you go out in public.  Find ways to manage stress. These may include: ? Using relaxation techniques like meditation and deep breathing. ? Getting regular exercise. Most women can continue their usual exercise routine during pregnancy. Ask your health care provider what activities are safe for you. ? Seeking support from family, friends, or spiritual resources. If you cannot be together in person, you can still connect by phone calls, texts, video calls, or online messaging. ? Spending time doing relaxing activities that you enjoy, like listening to music or reading a good book.  Ask for help if you have counseling or nutritional needs during pregnancy. Your health care provider can offer advice or refer you to resources or specialists who can help you with various needs.  Keep all follow-up visits as told by your health care provider. This is important. Where to find more information Centers for Disease Control and Prevention (CDC): http://gutierrez-robinson.com/ World Health Organization [facility]): https://www.morales.com/ [employer]necologists (ACOG): StickerEmporium.tn Questions to ask your health care team  What should I do if I have COVID-19 symptoms?  How will COVID-19 affect my prenatal care visits, tests and scans, labor and delivery, and postpartum care?  Should I plan to breastfeed my baby?  Where can I find mental health resources?  Where can I find support if I have financial concerns? Contact a  health care provider if:  You have signs and symptoms of infection, including a fever or cough. Tell your health care team that you think you may have a COVID-19 infection.  You have strong emotions, such as sadness or anxiety.  You feel unsafe in your home and need help finding a safe place to live.  You have bloody or watery vaginal discharge or vaginal bleeding. Get help right away if:  You have signs or symptoms of labor before 37 weeks of pregnancy. These include: ? Contractions that are 5 minutes or less apart, or that increase in frequency, intensity, or length. ? Sudden, sharp pain in the abdomen or in the lower back. ? A gush or trickle of fluid from your vagina.  You have difficulty breathing.  You have chest pain. These symptoms may represent a serious problem that is an emergency. Do not wait to see if the symptoms will go away. Get medical help right away. Call your local emergency services (911 in the [place] Do not drive yourself to the hospital. Summary  Coronavirus disease, also called COVID-19, is an infection of the lungs and airways (respiratory tract). It is unclear at this time if pregnancy makes you more susceptible to COVID-19 and what effects it may have on unborn babies.  It is important to take precautions to protect yourself and your developing baby. This  includes washing your hands often, avoiding touching your mouth, face, eyes, or nose, avoiding social gatherings and travel, and staying away from people who are sick.  If you think you have a COVID-19 infection, contact your health care provider right away. Tell your health care provider that you think you may have a COVID-19 infection.  If you have or may have COVID-19, your health care provider may recommend special precautions during your pregnancy, labor and delivery, and after your baby is born. This information is not intended to replace advice given to you by your health care provider. Make sure you  discuss any questions you have with your health care provider. Document Released: 09/02/2018 Document Revised: 09/02/2018 Document Reviewed: 09/02/2018 Elsevier Patient Education  [place].

## 2018-11-19 ENCOUNTER — Ambulatory Visit (INDEPENDENT_AMBULATORY_CARE_PROVIDER_SITE_OTHER): Payer: Medicaid Other

## 2018-11-19 ENCOUNTER — Ambulatory Visit (INDEPENDENT_AMBULATORY_CARE_PROVIDER_SITE_OTHER): Payer: Medicaid Other | Admitting: Obstetrics and Gynecology

## 2018-11-19 ENCOUNTER — Encounter: Payer: Self-pay | Admitting: Obstetrics and Gynecology

## 2018-11-19 VITALS — BP 100/48 | Wt 167.0 lb

## 2018-11-19 DIAGNOSIS — Z3A08 8 weeks gestation of pregnancy: Secondary | ICD-10-CM

## 2018-11-19 DIAGNOSIS — O34219 Maternal care for unspecified type scar from previous cesarean delivery: Secondary | ICD-10-CM

## 2018-11-19 DIAGNOSIS — Z3687 Encounter for antenatal screening for uncertain dates: Secondary | ICD-10-CM | POA: Diagnosis not present

## 2018-11-19 DIAGNOSIS — O0990 Supervision of high risk pregnancy, unspecified, unspecified trimester: Secondary | ICD-10-CM

## 2018-11-19 DIAGNOSIS — O99321 Drug use complicating pregnancy, first trimester: Secondary | ICD-10-CM

## 2018-11-19 DIAGNOSIS — O99320 Drug use complicating pregnancy, unspecified trimester: Secondary | ICD-10-CM | POA: Insufficient documentation

## 2018-11-19 DIAGNOSIS — F1120 Opioid dependence, uncomplicated: Secondary | ICD-10-CM

## 2018-11-19 DIAGNOSIS — O0991 Supervision of high risk pregnancy, unspecified, first trimester: Secondary | ICD-10-CM

## 2018-11-19 DIAGNOSIS — Z369 Encounter for antenatal screening, unspecified: Secondary | ICD-10-CM

## 2018-11-19 LAB — URINE DRUG PANEL 7
Amphetamines, Urine: NEGATIVE ng/mL
Barbiturate Quant, Ur: NEGATIVE ng/mL
Benzodiazepine Quant, Ur: NEGATIVE ng/mL
Cannabinoid Quant, Ur: NEGATIVE ng/mL
Cocaine (Metab.): NEGATIVE ng/mL
Opiate Quant, Ur: NEGATIVE ng/mL
PCP Quant, Ur: NEGATIVE ng/mL

## 2018-11-19 NOTE — Progress Notes (Signed)
    Routine Prenatal Care Visit  Subjective  Tammy [name] is a 28 y.o. [phone] at [redacted]w[redacted]d being seen today for ongoing prenatal care.  She is currently monitored for the following issues for this high-risk pregnancy and has Marijuana use; S/P cesarean section; Anxiety; Supervision of high risk pregnancy, antepartum; and Pregnancy complicated by subutex maintenance, antepartum ([place]) on their problem list.  ----------------------------------------------------------------------------------- Patient reports no complaints.   Contractions: Not present. Vag. Bleeding: None.  Movement: Absent. Denies leaking of fluid.  ----------------------------------------------------------------------------------- The following portions of the patient's history were reviewed and updated as appropriate: allergies, current medications, past family history, past medical history, past social history, past surgical history and problem list. Problem list updated.   Objective  Blood pressure (!) 100/48, weight 167 lb (75.8 kg). Pregravid weight 166 lb (75.3 kg) Total Weight Gain 1 lb (0.454 kg) Urinalysis:      Fetal Status: Fetal Heart Rate (bpm): 162   Movement: Absent     General:  Alert, oriented and cooperative. Patient is in no acute distress.  Skin: Skin is warm and dry. No rash noted.   Cardiovascular: Normal heart rate noted  Respiratory: Normal respiratory effort, no problems with respiration noted  Abdomen: Soft, gravid, appropriate for gestational age. Pain/Pressure: Absent     Pelvic:  Cervical exam deferred        Extremities: Normal range of motion.     Mental Status: Normal mood and affect. Normal behavior. Normal judgment and thought content.     Assessment   28 y.o. [id number] at [redacted]w[redacted]d by  06/29/2019, by Ultrasound presenting for routine prenatal visit  Plan   pregnancy4 Problems (from 09/19/18 to present)    Problem Noted Resolved   Supervision of high risk pregnancy, antepartum 11/18/2018  by Rexene Agent, CNM No   Overview Signed 11/18/2018  1:39 PM by Rexene Agent, [place] Prenatal Labs  Dating  Blood type:     Genetic Screen 1 Screen:    AFP:     Quad:     NIPS: Antibody:   Anatomic [place]  Rubella:   Varicella:    GTT Early:               Third trimester:  RPR:     Rhogam  HBsAg:     TDaP vaccine                       Flu Shot: HIV:     Baby Food                                GBS:   Contraception  Pap:  CBB     CS/VBAC    Support Person                  Gestational age appropriate obstetric precautions including but not limited to vaginal bleeding, contractions, leaking of fluid and fetal movement were reviewed in detail with the patient.    Discussed [place] results Patient declines genetic screening Would like gender reveal   Return in about 4 weeks (around 12/17/2018) for ROB in person.  Homero Fellers MD [facility], [facility] 11/19/2018, 1:45 PM

## 2018-11-19 NOTE — Progress Notes (Signed)
ROB/Dating No concerns  

## 2018-11-20 LAB — RPR+RH+ABO+RUB AB+AB SCR+CB...
Antibody Screen: NEGATIVE
HIV Screen 4th Generation wRfx: NONREACTIVE
Hematocrit: 37.4 % (ref 34.0–46.6)
Hemoglobin: 13.1 g/dL (ref 11.1–15.9)
Hepatitis B Surface Ag: NEGATIVE
MCH: 30.2 pg (ref 26.6–33.0)
MCHC: 35 g/dL (ref 31.5–35.7)
MCV: 86 fL (ref 79–97)
Platelets: 232 10*3/uL (ref 150–450)
RBC: 4.34 x10E6/uL (ref 3.77–5.28)
RDW: 12.6 % (ref 11.7–15.4)
RPR Ser Ql: NONREACTIVE
Rh Factor: POSITIVE
Rubella Antibodies, IGG: 2.07 index (ref >0.99)
Varicella zoster IgG: <135 index — ABNORMAL LOW (ref >165)
WBC: 4.8 10*3/uL (ref 3.4–10.8)

## 2018-11-20 LAB — URINE CULTURE

## 2018-11-25 LAB — CYTOLOGY - PAP
Chlamydia: NEGATIVE
Diagnosis: UNDETERMINED — AB
HPV: NOT DETECTED
Neisseria Gonorrhea: NEGATIVE

## 2018-11-26 ENCOUNTER — Telehealth: Payer: Self-pay | Admitting: *Deleted

## 2018-11-26 ENCOUNTER — Other Ambulatory Visit: Payer: Self-pay

## 2018-11-26 DIAGNOSIS — Z20822 Contact with and (suspected) exposure to covid-19: Secondary | ICD-10-CM

## 2018-11-26 NOTE — Telephone Encounter (Signed)
Contacted by Leana Gamer from the [facility] Department requesting that the pt be tested for COVID due to positive exposure; pt's DOB, address, and contact phone verified [phone]; will attempt to contact pt.

## 2018-11-26 NOTE — Telephone Encounter (Signed)
Contacted pt to schedule testing; pt accepted appointment at [facility] site 11/27/2018 at 1400; pt given address, location, and instructions that all occupants of her vehicle should wear masks; pt also advised that lab results should be available in 5-7 days, and are available in my chart; she verbalizes understanding; link sent to pt's cell phone; orders placed per protocol.

## 2018-11-27 ENCOUNTER — Other Ambulatory Visit: Payer: Medicaid Other

## 2018-11-27 DIAGNOSIS — Z20822 Contact with and (suspected) exposure to covid-19: Secondary | ICD-10-CM

## 2018-11-27 NOTE — Addendum Note (Signed)
Addended by: Denman George on: 11/27/2018 10:34 AM   Modules accepted: Orders

## 2018-12-02 LAB — NOVEL CORONAVIRUS, NAA: SARS-CoV-2, NAA: NOT DETECTED

## 2018-12-17 ENCOUNTER — Encounter: Payer: Medicaid Other | Admitting: Advanced Practice Midwife

## 2018-12-22 ENCOUNTER — Encounter: Payer: Medicaid Other | Admitting: Advanced Practice Midwife

## 2019-01-21 ENCOUNTER — Encounter: Payer: Self-pay | Admitting: Advanced Practice Midwife

## 2019-01-21 ENCOUNTER — Ambulatory Visit (INDEPENDENT_AMBULATORY_CARE_PROVIDER_SITE_OTHER): Payer: Medicaid Other | Admitting: Advanced Practice Midwife

## 2019-01-21 ENCOUNTER — Other Ambulatory Visit: Payer: Self-pay

## 2019-01-21 VITALS — BP 120/70 | Wt 168.0 lb

## 2019-01-21 DIAGNOSIS — Z3A17 17 weeks gestation of pregnancy: Secondary | ICD-10-CM

## 2019-01-21 DIAGNOSIS — O34219 Maternal care for unspecified type scar from previous cesarean delivery: Secondary | ICD-10-CM

## 2019-01-21 DIAGNOSIS — O0990 Supervision of high risk pregnancy, unspecified, unspecified trimester: Secondary | ICD-10-CM

## 2019-01-21 NOTE — Progress Notes (Signed)
ROB C/o cramping pain on right side  Declines flu shot

## 2019-01-21 NOTE — Progress Notes (Signed)
  Routine Prenatal Care Visit  Subjective  Tammy [name] is a 28 y.o. [phone] at [redacted]w[redacted]d being seen today for ongoing prenatal care.  She is currently monitored for the following issues for this high-risk pregnancy and has Marijuana use; S/P cesarean section; Anxiety; Supervision of high risk pregnancy, antepartum; and Pregnancy complicated by subutex maintenance, antepartum ([place]) on their problem list.  ----------------------------------------------------------------------------------- Patient reports cramping on her right lateral abdomen.   Contractions: Not present. Vag. Bleeding: None.  Movement: Absent. Leaking Fluid denies.  ----------------------------------------------------------------------------------- The following portions of the patient's history were reviewed and updated as appropriate: allergies, current medications, past family history, past medical history, past social history, past surgical history and problem list. Problem list updated.  Objective  Blood pressure 120/70, weight 168 lb (76.2 kg). Pregravid weight 166 lb (75.3 kg) Total Weight Gain 2 lb (0.907 kg) Urinalysis: Urine Protein    Urine Glucose    Fetal Status: Fetal Heart Rate (bpm): 143   Movement: Absent     General:  Alert, oriented and cooperative. Patient is in no acute distress.  Skin: Skin is warm and dry. No rash noted.   Cardiovascular: Normal heart rate noted  Respiratory: Normal respiratory effort, no problems with respiration noted  Abdomen: Soft, gravid, appropriate for gestational age. Pain/Pressure: Present     Pelvic:  Cervical exam deferred        Extremities: Normal range of motion.     Mental Status: Normal mood and affect. Normal behavior. Normal judgment and thought content.   Assessment   28 y.o. [id number] at [redacted]w[redacted]d by  06/29/2019, by Ultrasound presenting for routine prenatal visit  Plan   pregnancy4 Problems (from 09/19/18 to present)    Problem Noted Resolved   Pregnancy  complicated by subutex maintenance, antepartum ([place]) 11/19/2018 by Homero Fellers, MD No   Supervision of high risk pregnancy, antepartum 11/18/2018 by Rexene Agent, CNM No   Overview Addendum 11/19/2018  1:43 PM by Homero Fellers, MD    Clinic [facility] Prenatal Labs  Dating  8 wk [place] Blood type:     Genetic Screen Declines Antibody:   Anatomic [place]  Rubella:   Varicella:    GTT  Third trimester:  RPR:     Rhogam  HBsAg:     TDaP vaccine                       Flu Shot: HIV:     Baby Food                                GBS:   Contraception  Pap: 2020  CBB     CS/VBAC    Support Person                  Preterm labor symptoms and general obstetric precautions including but not limited to vaginal bleeding, contractions, leaking of fluid and fetal movement were reviewed in detail with the patient. Please refer to After Visit Summary for other counseling recommendations.  Round ligament: heat, stretches, tylenol  Return in about 2 weeks (around 02/04/2019) for anatomy scan and rob.  Rod Can, CNM 01/21/2019 4:26 PM

## 2019-01-27 ENCOUNTER — Telehealth: Payer: Self-pay

## 2019-01-27 MED ORDER — PRENATAL MULTIVITAMIN CH: 1.0000 | ORAL_TABLET | Freq: Every day | ORAL | 11 refills | Status: DC

## 2019-01-27 NOTE — Telephone Encounter (Signed)
Pt calling for refill of prenatal vitamin tablets.  Stressed 'tablets'.  To [facility]  [phone]

## 2019-02-02 ENCOUNTER — Emergency Department
Admission: EM | Admit: 2019-02-02 | Discharge: 2019-02-02 | Disposition: A | Discharge status: Home / Self Care | Payer: Medicaid Other | Attending: Emergency Medicine | Admitting: Emergency Medicine

## 2019-02-02 ENCOUNTER — Other Ambulatory Visit: Payer: Self-pay

## 2019-02-02 ENCOUNTER — Encounter: Payer: Self-pay | Admitting: Emergency Medicine

## 2019-02-02 DIAGNOSIS — Z20822 Contact with and (suspected) exposure to covid-19: Secondary | ICD-10-CM

## 2019-02-02 DIAGNOSIS — Z20828 Contact with and (suspected) exposure to other viral communicable diseases: Secondary | ICD-10-CM | POA: Diagnosis not present

## 2019-02-02 DIAGNOSIS — J069 Acute upper respiratory infection, unspecified: Secondary | ICD-10-CM | POA: Insufficient documentation

## 2019-02-02 DIAGNOSIS — J45909 Unspecified asthma, uncomplicated: Secondary | ICD-10-CM | POA: Diagnosis not present

## 2019-02-02 DIAGNOSIS — J029 Acute pharyngitis, unspecified: Secondary | ICD-10-CM | POA: Diagnosis present

## 2019-02-02 NOTE — ED Triage Notes (Signed)
Body aches, runny nose and sore throat x 2 days.

## 2019-02-02 NOTE — ED Notes (Signed)
See triage note  Presents with sore throat,runny nose,body aches and chills    States theses sx's started about 3 days ago  Afebrile on arrival  States that she has been around her aunt that was positive for COVID several days ago

## 2019-02-02 NOTE — ED Provider Notes (Signed)
[facility] Emergency Department Provider Note   ____________________________________________   First MD Initiated Contact with Patient 02/02/19 1411     (approximate)  I have reviewed the triage vital signs and the nursing notes.   HISTORY  Chief Complaint Generalized Body Aches and Sore Throat   HPI Tammy FujitaJessica D Gerber is a 28 y.o. female presents to the ED with complaint of body aches, runny nose and sore throat for the last 2 days.  Patient denies any cough, shortness of breath or difficulty breathing.  She also has no symptoms of GI or GU problems.  Patient does report that she was around her aunt who was diagnosed with COVID.  Her aunt told her that she was not contagious because she was on medication for COVID.  Patient rates her pain as 6 out of 10.     Past Medical History:  Diagnosis Date  . ADHD (attention deficit hyperactivity disorder)   . Anxiety   . Asthma   . Depression   . Herpes genitalia   . History of chlamydia   . Substance abuse [facility](HCC)     Patient Active Problem List   Diagnosis Date Noted  . Pregnancy complicated by subutex maintenance, antepartum (HCC) 11/19/2018  . Supervision of high risk pregnancy, antepartum 11/18/2018  . Anxiety 03/29/2016  . S/P cesarean section 02/16/2016  . Marijuana use 07/26/2015    Past Surgical History:  Procedure Laterality Date  . CESAREAN SECTION  09/30/2013  . CESAREAN SECTION N/A 02/16/2016   Procedure: REPEAT CESAREAN SECTION;  Surgeon: Hildred LaserAnika Cherry, MD;  Location: [facility] ORS;  Service: Obstetrics;  Laterality: N/A;  Female @ 1435 Wt: 6lb 13oz Apgars: 8/9  . PEG TUBE PLACEMENT Bilateral 2011  . TONSILLECTOMY  2011    Prior to Admission medications   Medication Sig Start Date End Date Taking? Authorizing Provider  buprenorphine (SUBUTEX) 8 MG SUBL SL tablet Place 8 mg under the tongue daily.    [provider]  busPIRone (BUSPAR) 15 MG tablet  01/12/19   [provider]  clonazePAM (KLONOPIN) 0.5 MG tablet  01/12/19   [provider]  escitalopram (LEXAPRO) 20 MG tablet  01/12/19   [provider]  loratadine (CLARITIN) 10 MG tablet Take 10 mg by mouth daily. 01/07/19   [provider]  traZODone (DESYREL) 50 MG tablet  01/12/19   [provider]    Allergies Hydrocodone-acetaminophen, Morphine and related, Other, Peanuts [peanut oil], and Penicillins  Family History  Problem Relation Age of Onset  . Cancer Maternal Grandmother        thyroid    Social History Social History   Tobacco Use  . Smoking status: Never Smoker  . Smokeless tobacco: Never Used  Substance Use Topics  . Alcohol use: No  . Drug use: No    Review of Systems Constitutional: No fever/occasional chills Eyes: No visual changes. ENT: Positive sore throat. Cardiovascular: Denies chest pain. Respiratory: Denies shortness of breath.  Occasional cough. Gastrointestinal: No abdominal pain.  No nausea, no vomiting.  Genitourinary: Negative for dysuria. Musculoskeletal: Negative for back pain. Skin: Negative for rash. Neurological: Negative for headaches, focal weakness or numbness. ____________________________________________   PHYSICAL EXAM:  VITAL SIGNS: ED Triage Vitals  Enc Vitals Group     BP 02/02/19 1330 (!) 146/100     Pulse Rate 02/02/19 1330 66     Resp 02/02/19 1330 20     Temp 02/02/19 1330 98.3 F (36.8 C)  Temp Source 02/02/19 1330 Oral     SpO2 02/02/19 1330 97 %     Weight 02/02/19 1332 160 lb (72.6 kg)     Height 02/02/19 1332 5\' 4"  (1.626 m)     Head Circumference --      Peak Flow --      Pain Score 02/02/19 1332 6     Pain Loc --      Pain Edu? --      Excl. in GC? --     Constitutional: Alert and oriented. Well appearing and in no acute distress. Eyes: Conjunctivae are normal. PERRL. EOMI. Head: Atraumatic. Nose: Minimal congestion/negative rhinnorhea. Mouth/Throat: Mucous membranes are moist.   Oropharynx non-erythematous.  No exudate was noted and uvula was midline. Neck: No stridor.   Hematological/Lymphatic/Immunilogical: No cervical lymphadenopathy. Cardiovascular: Normal rate, regular rhythm. Grossly normal heart sounds.  Good peripheral circulation. Respiratory: Normal respiratory effort.  No retractions. Lungs CTAB. Gastrointestinal: Soft and nontender. No distention. Musculoskeletal: Moves upper and lower extremities without any difficulty.  Normal gait was noted. Neurologic:  Normal speech and language. No gross focal neurologic deficits are appreciated. No gait instability. Skin:  Skin is warm, dry and intact. No rash noted. Psychiatric: Mood and affect are normal. Speech and behavior are normal.  ____________________________________________   LABS (all labs ordered are listed, but only abnormal results are displayed)  Labs Reviewed  NOVEL CORONAVIRUS, NAA (HOSP ORDER, SEND-OUT TO REF LAB; TAT 18-24 HRS)    PROCEDURES  Procedure(s) performed (including Critical Care):  Procedures   ____________________________________________   INITIAL IMPRESSION / ASSESSMENT AND PLAN / ED COURSE  As part of my medical decision making, I reviewed the following data within the electronic MEDICAL RECORD NUMBER Notes from prior ED visits and [place] Controlled Substance Database  GIFT REIMERS was evaluated in Emergency Department on 02/02/2019 for the symptoms described in the history of present illness. She was evaluated in the context of the global COVID-19 pandemic, which necessitated consideration that the patient might be at risk for infection with the SARS-CoV-2 virus that causes COVID-19. Institutional protocols and algorithms that pertain to the evaluation of patients at risk for COVID-19 are in a state of rapid change based on information released by regulatory bodies including the CDC and federal and state organizations. These policies and algorithms were followed during the  patient's care in the ED.  28 year old female presents to the ED with complaints of rhinorrhea, body aches, chills and occasional cough.  She states that she has been around her aunt who was positive for COVID.  Physical exam was unremarkable with some minimal nasal congestion.  Patient refused a chest x-ray but did have the COVID test done.  Patient was given an excuse for the next 2 days which should give her enough time to have the results of her COVID test.  She was made aware that she would be quarantined and additional 10 more days if her cover test is positive.  Patient became verbally abusive to staff members stating that the paper has to say that she cannot come out until after the 17th because she has a court date that she is supposed to be at.  It was explained to her that if she was positive that she would be quarantined for an additional 10 days. ____________________________________________   FINAL CLINICAL IMPRESSION(S) / ED DIAGNOSES  Final diagnoses:  Viral URI with cough  Close Exposure to Covid-19 Virus     ED Discharge Orders  None       Note:  This document was prepared using Dragon voice recognition software and may include unintentional dictation errors.    Johnn Hai, PA-C 02/02/19 1806    Blake Divine, MD 02/04/19 [phone]

## 2019-02-02 NOTE — ED Notes (Signed)
Patient was not happy with the dates given in the work note. Letitia Libra, PA-C informed. Patient was verbally abusive to this staff member and was unhappy to be given water with the graham crackers and peanut butter provided by staff.

## 2019-02-02 NOTE — Discharge Instructions (Signed)
Your COVID test is being done today and should have results tomorrow.  You will need to self quarantine until you have heard the results.  If your COVID 19 test is positive you will need to quarantine an extra 10 days

## 2019-02-03 ENCOUNTER — Encounter: Payer: Medicaid Other | Admitting: Maternal Newborn

## 2019-02-03 ENCOUNTER — Ambulatory Visit: Payer: Medicaid Other

## 2019-02-03 ENCOUNTER — Other Ambulatory Visit: Payer: Self-pay | Admitting: Maternal Newborn

## 2019-02-03 LAB — NOVEL CORONAVIRUS, NAA (HOSP ORDER, SEND-OUT TO REF LAB; TAT 18-24 HRS): SARS-CoV-2, NAA: NOT DETECTED

## 2019-02-03 NOTE — Progress Notes (Signed)
Unable to see for visit today, COVID-19 results are still pending from 02/02/2019.

## 2019-02-05 ENCOUNTER — Other Ambulatory Visit: Payer: Self-pay | Admitting: Advanced Practice Midwife

## 2019-02-05 DIAGNOSIS — O0990 Supervision of high risk pregnancy, unspecified, unspecified trimester: Secondary | ICD-10-CM

## 2019-02-05 MED ORDER — CITRANATAL RX 27-1 MG PO TABS: 1.0000 | ORAL_TABLET | Freq: Every day | ORAL | 7 refills | Status: DC

## 2019-02-05 NOTE — Progress Notes (Unsigned)
Rx sent to [facility]

## 2019-02-05 NOTE — Telephone Encounter (Signed)
Only recent lab was covid test- she is negative. Not sure what else she could be referring to? I did not order that lab btw so whoever ordered it should get back to her but you can call her and tell her it's negative. Also let her know Rx was sent to [facility] [place]. Thanks

## 2019-02-05 NOTE — Telephone Encounter (Signed)
Patient is aware to pick up prescription at pharmacy

## 2019-02-05 NOTE — Telephone Encounter (Signed)
Patient is calling to follow up on results. Please advise . Patient is requesting refill on prenatal vitamins that don't have a fish flavor to it

## 2019-02-09 ENCOUNTER — Telehealth: Payer: Self-pay

## 2019-02-09 NOTE — Telephone Encounter (Signed)
Pt would like a different prenatal vitamin sent in to pharmacy because the pharmacy she has does not have the kind that was rxed and she does not have money to get prenatals OTC. Please advise. Thank you

## 2019-02-10 ENCOUNTER — Other Ambulatory Visit: Payer: Self-pay | Admitting: Advanced Practice Midwife

## 2019-02-10 DIAGNOSIS — O0990 Supervision of high risk pregnancy, unspecified, unspecified trimester: Secondary | ICD-10-CM

## 2019-02-10 MED ORDER — PRENATAL VITAMIN PLUS LOW IRON 27-1 MG PO TABS: 1.0000 | ORAL_TABLET | Freq: Every day | ORAL | 7 refills | Status: DC

## 2019-02-10 NOTE — Telephone Encounter (Signed)
Please let her know I sent a new PNV prescription to her pharmacy.

## 2019-02-10 NOTE — Progress Notes (Unsigned)
Rx PNV sent to patient's pharmacy [place].

## 2019-02-16 ENCOUNTER — Other Ambulatory Visit: Payer: Self-pay

## 2019-02-16 ENCOUNTER — Ambulatory Visit (INDEPENDENT_AMBULATORY_CARE_PROVIDER_SITE_OTHER): Payer: Medicaid Other

## 2019-02-16 ENCOUNTER — Encounter: Payer: Self-pay | Admitting: Obstetrics and Gynecology

## 2019-02-16 ENCOUNTER — Ambulatory Visit (INDEPENDENT_AMBULATORY_CARE_PROVIDER_SITE_OTHER): Payer: Medicaid Other | Admitting: Obstetrics and Gynecology

## 2019-02-16 VITALS — BP 122/74 | Wt 173.0 lb

## 2019-02-16 DIAGNOSIS — F419 Anxiety disorder, unspecified: Secondary | ICD-10-CM

## 2019-02-16 DIAGNOSIS — Z98891 History of uterine scar from previous surgery: Secondary | ICD-10-CM

## 2019-02-16 DIAGNOSIS — O99320 Drug use complicating pregnancy, unspecified trimester: Secondary | ICD-10-CM

## 2019-02-16 DIAGNOSIS — F1290 Cannabis use, unspecified, uncomplicated: Secondary | ICD-10-CM

## 2019-02-16 DIAGNOSIS — Z363 Encounter for antenatal screening for malformations: Secondary | ICD-10-CM | POA: Diagnosis not present

## 2019-02-16 DIAGNOSIS — O0992 Supervision of high risk pregnancy, unspecified, second trimester: Secondary | ICD-10-CM

## 2019-02-16 DIAGNOSIS — O0990 Supervision of high risk pregnancy, unspecified, unspecified trimester: Secondary | ICD-10-CM

## 2019-02-16 DIAGNOSIS — Z3A21 21 weeks gestation of pregnancy: Secondary | ICD-10-CM

## 2019-02-16 DIAGNOSIS — O99322 Drug use complicating pregnancy, second trimester: Secondary | ICD-10-CM

## 2019-02-16 DIAGNOSIS — F1120 Opioid dependence, uncomplicated: Secondary | ICD-10-CM

## 2019-02-16 NOTE — Progress Notes (Signed)
Routine Prenatal Care Visit  Subjective  Tammy [name] is a 28 y.o. [phone] at [redacted]w[redacted]d being seen today for ongoing prenatal care.  She is currently monitored for the following issues for this high-risk pregnancy and has Marijuana use; S/P cesarean section; Anxiety; Supervision of high risk pregnancy, antepartum; and Pregnancy complicated by subutex maintenance, antepartum (HCC) on their problem list.  ----------------------------------------------------------------------------------- Patient reports no complaints.   Contractions: Not present. Vag. Bleeding: None.  Movement: Present. Leaking Fluid denies.  Anatomy u/s complete and normal today. Patient would like to discuss TOLAC.  First c-section was noted to be due to fetal intolerance of labor in the op note. The second c-section was elective. However, the pre-operative diagnosis of the second c-section was G1 with failure to progress.   ----------------------------------------------------------------------------------- The following portions of the patient's history were reviewed and updated as appropriate: allergies, current medications, past family history, past medical history, past social history, past surgical history and problem list. Problem list updated.  Objective  Blood pressure 122/74, weight 173 lb (78.5 kg). Pregravid weight 166 lb (75.3 kg) Total Weight Gain 7 lb (3.175 kg) Urinalysis: Urine Protein    Urine Glucose    Fetal Status: Fetal Heart Rate (bpm): 144   Movement: Present     General:  Alert, oriented and cooperative. Patient is in no acute distress.  Skin: Skin is warm and dry. No rash noted.   Cardiovascular: Normal heart rate noted  Respiratory: Normal respiratory effort, no problems with respiration noted  Abdomen: Soft, gravid, appropriate for gestational age. Pain/Pressure: Absent     Pelvic:  Cervical exam deferred        Extremities: Normal range of motion.     Mental Status: Normal mood and affect.  Normal behavior. Normal judgment and thought content.   US Ob Comp + 14 Wk  Result Date: 02/16/2019 Patient Name: Tammy [name] DOB: [birth year] MRN: [id number] ULTRASOUND REPORT Location: [facility] Date of Service: 02/16/2019 Indications:Anatomy Ultrasound Findings: Mason Jim intrauterine pregnancy is visualized with FHR at 144 BPM. Biometrics give an (U/S) Gestational age of [redacted]w[redacted]d and an (U/S) EDD of 07/03/2019; this correlates with the clinically established Estimated Date of Delivery: 06/29/2019 Fetal presentation is Breech. EFW: 371 g ( 13 oz ) . Placenta: posterior. Grade: 0 AFI: subjectively normal. Anatomic survey is complete and normal; Gender - surprise.  Impression: 1. [redacted]w[redacted]d Viable Singleton Intrauterine pregnancy by U/S. 2. (U/S) EDD is consistent with Clinically established Estimated Date of Delivery: 06/29/19 . 3. Normal Anatomy Scan Deanna Artis, RT There is a singleton gestation with subjectively normal amniotic fluid volume. The fetal biometry correlates with established dating. Detailed evaluation of the fetal anatomy was performed.The fetal anatomical survey appears within normal limits within the resolution of ultrasound as described above.  It must be noted that a normal ultrasound is unable to rule out fetal aneuploidy nor is it able to detect all possible malformations.   The ultrasound images and findings were reviewed by me and I agree with the above report. Thomasene Mohair, MD, [facility], [facility] 02/16/2019 10:25 AM      Assessment   27 y.o. [id number] at [redacted]w[redacted]d by  06/29/2019, by Ultrasound presenting for routine prenatal visit  Plan   pregnancy4 Problems (from 09/19/18 to present)    Problem Noted Resolved   Pregnancy complicated by subutex maintenance, antepartum (HCC) 11/19/2018 by Natale Milch, MD No   Supervision of high risk pregnancy, antepartum 11/18/2018 by Oswaldo Conroy,  CNM No   Overview Addendum 11/19/2018  1:43 PM by  Homero Fellers, MD    Clinic [facility] Prenatal Labs  Dating  8 wk [place] Blood type:     Genetic Screen Declines Antibody:   Anatomic [place]  Rubella:   Varicella:    GTT  Third trimester:  RPR:     Rhogam  HBsAg:     TDaP vaccine                       Flu Shot: HIV:     Baby Food                                GBS:   Contraception  Pap: 2020  CBB     CS/VBAC    Support Person                  Preterm labor symptoms and general obstetric precautions including but not limited to vaginal bleeding, contractions, leaking of fluid and fetal movement were reviewed in detail with the patient. Please refer to After Visit Summary for other counseling recommendations.   -Will review prior notes to determine if patient is a good candidate for TOLAC. It is unclear at this point. But, likely the first operative report is the more accurate, which indicated fetal intolerance of labor. This would improve her candidacy for TOLAC.  Return in about 4 weeks (around 03/16/2019) for Routine Prenatal Appointment.  Prentice Docker, MD, [facility], [facility] 02/17/2019 6:26 PM

## 2019-02-18 ENCOUNTER — Telehealth: Payer: Self-pay | Admitting: Obstetrics and Gynecology

## 2019-02-18 DIAGNOSIS — Z98891 History of uterine scar from previous surgery: Secondary | ICD-10-CM

## 2019-02-18 NOTE — Telephone Encounter (Signed)
Discussed my findings from her records of why she had her first c-section. The first op note state that the c-section was done due to fetal intolerance of labor. The patient states there was some sort of incident involving morphine and her having a bad reaction to it.  The second operative report states that the first c-section was due to failure to progress. Again, I believe the first operative note to be more likely to be accurate because it was written at the time of the delivery while the events were unfolding.   We discussed that she would likely be a reasonable candidate for TOLAC.  We discussed that a full discussion on the risks and benefits of TOLAC would need to be held so that she could make a more informed decision. She voiced understanding. Will continue discussion.

## 2019-03-30 ENCOUNTER — Telehealth: Payer: Self-pay

## 2019-03-30 ENCOUNTER — Observation Stay
Admission: EM | Admit: 2019-03-30 | Discharge: 2019-03-30 | Disposition: A | Discharge status: Home / Self Care | Payer: Medicaid Other | Attending: Obstetrics and Gynecology | Admitting: Obstetrics and Gynecology

## 2019-03-30 ENCOUNTER — Other Ambulatory Visit: Payer: Self-pay

## 2019-03-30 DIAGNOSIS — F419 Anxiety disorder, unspecified: Secondary | ICD-10-CM | POA: Diagnosis not present

## 2019-03-30 DIAGNOSIS — Z3A27 27 weeks gestation of pregnancy: Secondary | ICD-10-CM | POA: Insufficient documentation

## 2019-03-30 DIAGNOSIS — B9689 Other specified bacterial agents as the cause of diseases classified elsewhere: Secondary | ICD-10-CM

## 2019-03-30 DIAGNOSIS — N760 Acute vaginitis: Secondary | ICD-10-CM

## 2019-03-30 DIAGNOSIS — O99512 Diseases of the respiratory system complicating pregnancy, second trimester: Secondary | ICD-10-CM | POA: Insufficient documentation

## 2019-03-30 DIAGNOSIS — F1290 Cannabis use, unspecified, uncomplicated: Secondary | ICD-10-CM

## 2019-03-30 DIAGNOSIS — R102 Pelvic and perineal pain: Secondary | ICD-10-CM | POA: Diagnosis not present

## 2019-03-30 DIAGNOSIS — O23592 Infection of other part of genital tract in pregnancy, second trimester: Secondary | ICD-10-CM | POA: Insufficient documentation

## 2019-03-30 DIAGNOSIS — Z885 Allergy status to narcotic agent status: Secondary | ICD-10-CM | POA: Diagnosis not present

## 2019-03-30 DIAGNOSIS — O99320 Drug use complicating pregnancy, unspecified trimester: Secondary | ICD-10-CM

## 2019-03-30 DIAGNOSIS — Z880 Allergy status to penicillin: Secondary | ICD-10-CM | POA: Insufficient documentation

## 2019-03-30 DIAGNOSIS — F329 Major depressive disorder, single episode, unspecified: Secondary | ICD-10-CM | POA: Diagnosis not present

## 2019-03-30 DIAGNOSIS — J45909 Unspecified asthma, uncomplicated: Secondary | ICD-10-CM | POA: Insufficient documentation

## 2019-03-30 DIAGNOSIS — O98812 Other maternal infectious and parasitic diseases complicating pregnancy, second trimester: Principal | ICD-10-CM | POA: Insufficient documentation

## 2019-03-30 DIAGNOSIS — Z98891 History of uterine scar from previous surgery: Secondary | ICD-10-CM

## 2019-03-30 DIAGNOSIS — O99342 Other mental disorders complicating pregnancy, second trimester: Secondary | ICD-10-CM | POA: Insufficient documentation

## 2019-03-30 DIAGNOSIS — F1120 Opioid dependence, uncomplicated: Secondary | ICD-10-CM

## 2019-03-30 DIAGNOSIS — O0990 Supervision of high risk pregnancy, unspecified, unspecified trimester: Secondary | ICD-10-CM

## 2019-03-30 DIAGNOSIS — O26892 Other specified pregnancy related conditions, second trimester: Secondary | ICD-10-CM

## 2019-03-30 LAB — WET PREP, GENITAL
Sperm: NONE SEEN
Trich, Wet Prep: NONE SEEN
Yeast Wet Prep HPF POC: NONE SEEN

## 2019-03-30 LAB — URINALYSIS, COMPLETE (UACMP) WITH MICROSCOPIC
Bacteria, UA: NONE SEEN
Bilirubin Urine: NEGATIVE
Glucose, UA: NEGATIVE mg/dL
Hgb urine dipstick: NEGATIVE
Ketones, ur: NEGATIVE mg/dL
Nitrite: NEGATIVE
Protein, ur: 30 mg/dL — AB
Specific Gravity, Urine: 1.027 (ref 1.005–1.030)
WBC, UA: >50 WBC/hpf — ABNORMAL HIGH (ref 0–5)
pH: 6 (ref 5.0–8.0)

## 2019-03-30 LAB — CBC
HCT: 33.7 % — ABNORMAL LOW (ref 36.0–46.0)
Hemoglobin: 11.7 g/dL — ABNORMAL LOW (ref 12.0–15.0)
MCH: 31.1 pg (ref 26.0–34.0)
MCHC: 34.7 g/dL (ref 30.0–36.0)
MCV: 89.6 fL (ref 80.0–100.0)
Platelets: 199 10*3/uL (ref 150–400)
RBC: 3.76 MIL/uL — ABNORMAL LOW (ref 3.87–5.11)
RDW: 13.6 % (ref 11.5–15.5)
WBC: 10.3 10*3/uL (ref 4.0–10.5)
nRBC: 0 % (ref 0.0–0.2)

## 2019-03-30 LAB — CHLAMYDIA/NGC RT PCR (ARMC ONLY)
Chlamydia Tr: NOT DETECTED
N gonorrhoeae: NOT DETECTED

## 2019-03-30 MED ORDER — METRONIDAZOLE 0.75 % VA GEL: 1.0000 | Freq: Every day | VAGINAL | 1 refills | Status: AC

## 2019-03-30 NOTE — OB Triage Note (Signed)
Pt G4P2 [redacted]w[redacted]d complains of pelvic pressure for 7 days. She states she had some spotting for 2 days but has not had anything concerning today. She denies LOF. She states + FM. VSS. Monitors applied and assessing.

## 2019-03-30 NOTE — Progress Notes (Signed)
RN reviewed discharge instructions with pt. Went over medications to take as prescribed. Instructed to return for monitoring for ctx, LOF, vaginal bleeding, and decreased fetal movement. Pt verbalized understanding.

## 2019-03-30 NOTE — Telephone Encounter (Signed)
Pt calling c/o a lot of pressure in her private area; doesn't know if having ctxs or not; went to void and only a little came out and there was blood.  Adv to be seen at [facility] and be checked for a UTI as we have no availability this week.  Pt doesn't understand why she wasn't called to be scheduled like she was told she would be.  I apologized for that and adv I would have them call her and get her scheduled.  Pt states she can be reached at either [phone] or [phone].

## 2019-03-30 NOTE — Telephone Encounter (Signed)
Patient hasn't been seen since 02/13/19 patient is about [redacted] week pregnant please advise if we need to go ahead and schedule her 28 week labs at next visit. Please advise

## 2019-03-30 NOTE — Telephone Encounter (Signed)
Called and left voice mail for patient to call back to be schedule °

## 2019-03-30 NOTE — Discharge Summary (Signed)
Physician Final Progress Note  Patient ID: Tammy [name] MRN: [id number] DOB/AGE: Jan 25, 1991 27 y.o.  Admit date: 03/30/2019 Admitting provider: Vena Austria, MD Discharge date: 03/30/2019   Admission Diagnoses: vaginal pressure  Discharge Diagnoses:  Active Problems:   Indication for care in labor and delivery, antepartum   BV (bacterial vaginosis) IUP at 27 weeks Reactive NST  History of Present Illness: The patient is a 28 y.o. female [phone] at [redacted]w[redacted]d who presents for vaginal pain and pressure that started 1 week ago. She admits positive fetal movement. She denies contractions, leaking of fluid or vaginal bleeding. She denies vaginal discharge, irritation, itching or odor. She denies burning or frequency of urination. She denies fever, chills, nausea, vomiting.   Past Medical History:  Diagnosis Date  . ADHD (attention deficit hyperactivity disorder)   . Anxiety   . Asthma   . Depression   . Herpes genitalia   . History of chlamydia   . Substance abuse [facility])     Past Surgical History:  Procedure Laterality Date  . CESAREAN SECTION  09/30/2013  . CESAREAN SECTION N/A 02/16/2016   Procedure: REPEAT CESAREAN SECTION;  Surgeon: Hildred Laser, MD;  Location: [facility] ORS;  Service: Obstetrics;  Laterality: N/A;  Female @ 1435 Wt: 6lb 13oz Apgars: 8/9  . PEG TUBE PLACEMENT Bilateral 2011  . TONSILLECTOMY  2011    No current facility-administered medications on file prior to encounter.    Current Outpatient Medications on File Prior to Encounter  Medication Sig Dispense Refill  . buprenorphine (SUBUTEX) 8 MG SUBL SL tablet Place 8 mg under the tongue daily.    . clonazePAM (KLONOPIN) 0.5 MG tablet     . Prenatal Vit-Fe Fumarate-FA (PRENATAL VITAMIN PLUS LOW IRON) 27-1 MG TABS Take 1 tablet by mouth daily. 30 tablet 7  . busPIRone (BUSPAR) 15 MG tablet     . escitalopram (LEXAPRO) 20 MG tablet     . loratadine (CLARITIN) 10 MG tablet Take 10 mg by mouth daily.    .  traZODone (DESYREL) 50 MG tablet       Allergies  Allergen Reactions  . Hydrocodone-Acetaminophen Itching  . Morphine And Related Itching  . Other Swelling    SUBOXONE AND NALOXONE    . Peanuts [Peanut Oil] Swelling  . Penicillins Hives    Social History   Socioeconomic History  . Marital status: Single    Spouse name: Not on file  . Number of children: Not on file  . Years of education: Not on file  . Highest education level: Not on file  Occupational History  . Not on file  Social Needs  . Financial resource strain: Not hard at all  . Food insecurity    Worry: Never true    Inability: Never true  . Transportation needs    Medical: No    Non-medical: No  Tobacco Use  . Smoking status: Never Smoker  . Smokeless tobacco: Never Used  Substance and Sexual Activity  . Alcohol use: No  . Drug use: No  . Sexual activity: Yes    Birth control/protection: Injection  Lifestyle  . Physical activity    Days per week: 0 days    Minutes per session: 0 min  . Stress: Not at all  Relationships  . Social connections    Talks on phone: More than three times a week    Gets together: More than three times a week    Attends religious service: Never    Active  member of club or organization: No    Attends meetings of clubs or organizations: Never    Relationship status: Not on file  . Intimate partner violence    Fear of current or ex partner: No    Emotionally abused: No    Physically abused: No    Forced sexual activity: No  Other Topics Concern  . Not on file  Social History Narrative  . Not on file    Family History  Problem Relation Age of Onset  . Cancer Maternal Grandmother        thyroid     Review of Systems  Constitutional: Negative.   HENT: Negative.   Eyes: Negative.   Respiratory: Negative.   Cardiovascular: Negative.   Gastrointestinal: Negative.   Genitourinary:       Vaginal pain and pressure  Musculoskeletal: Negative.   Skin: Negative.    Neurological: Negative.   Endo/Heme/Allergies: Negative.   Psychiatric/Behavioral: Negative.      Physical Exam: BP 112/66 (BP Location: Left Arm)   Pulse 70   Temp 98.4 F (36.9 C) (Oral)   Resp 16   Ht 5\' 4"  (1.626 m)   Wt 77.1 kg   LMP  (LMP Unknown)   BMI 29.18 kg/m   Constitutional: Well nourished, well developed female in no acute distress.  HEENT: normal Skin: Warm and dry.  Cardiovascular: Regular rate and rhythm.   Extremity: no edema  Respiratory: Clear to auscultation bilateral. Normal respiratory effort Abdomen: FHT present Back: no CVAT Neuro: DTRs 2+, Cranial nerves grossly intact Psych: Alert and Oriented x3. No memory deficits. Normal mood and affect.  MS: normal gait, normal bilateral lower extremity ROM/strength/stability.  Pelvic exam: (female chaperone present) is not limited by body habitus EGBUS: within normal limits Vagina: within normal limits and with normal mucosa, scant white discharge Cervix: closed/thick/high  Toco: negative for contractions Fetal well being: 145 bpm, moderate variability, +accelerations, -decelerations   Consults: None  Significant Findings/ Diagnostic Studies: labs:   Results for Tammy, [name] (MRN [id number]) as of 03/30/2019 18:31  Ref. Range 03/30/2019 17:00 03/30/2019 17:26  WBC Latest Ref Range: 4.0 - 10.5 K/uL  10.3  RBC Latest Ref Range: 3.87 - 5.11 MIL/uL  3.76 (L)  Hemoglobin Latest Ref Range: 12.0 - 15.0 g/dL  11.7 (L)  HCT Latest Ref Range: 36.0 - 46.0 %  33.7 (L)  MCV Latest Ref Range: 80.0 - 100.0 fL  89.6  MCH Latest Ref Range: 26.0 - 34.0 pg  31.1  MCHC Latest Ref Range: 30.0 - 36.0 g/dL  34.7  RDW Latest Ref Range: 11.5 - 15.5 %  13.6  Platelets Latest Ref Range: 150 - 400 K/uL  199  nRBC Latest Ref Range: 0.0 - 0.2 %  0.0  Yeast Wet Prep HPF POC Latest Ref Range: NONE SEEN  NONE SEEN   Trich, Wet Prep Latest Ref Range: NONE SEEN  NONE SEEN   Clue Cells Wet Prep HPF POC Latest Ref Range: NONE  SEEN  PRESENT (A)   WBC, Wet Prep HPF POC Latest Ref Range: NONE SEEN  FEW (A)   Appearance Latest Ref Range: CLEAR  CLOUDY (A)   Bilirubin Urine Latest Ref Range: NEGATIVE  NEGATIVE   Color, Urine Latest Ref Range: YELLOW  YELLOW (A)   Glucose, UA Latest Ref Range: NEGATIVE mg/dL NEGATIVE   Hgb urine dipstick Latest Ref Range: NEGATIVE  NEGATIVE   Ketones, ur Latest Ref Range: NEGATIVE mg/dL NEGATIVE   Leukocytes,Ua Latest Ref  Range: NEGATIVE  LARGE (A)   Nitrite Latest Ref Range: NEGATIVE  NEGATIVE   pH Latest Ref Range: 5.0 - 8.0  6.0   Protein Latest Ref Range: NEGATIVE mg/dL 30 (A)   Specific Gravity, Urine Latest Ref Range: 1.005 - 1.030  1.027   Bacteria, UA Latest Ref Range: NONE SEEN  NONE SEEN   Mucus Unknown PRESENT   RBC / HPF Latest Ref Range: 0 - 5 RBC/hpf 11-20   Squamous Epithelial / LPF Latest Ref Range: 0 - 5  6-10   WBC, UA Latest Ref Range: 0 - 5 WBC/hpf >50 (H)   WET PREP, GENITAL Unknown Rpt (A)     Ref Range & Units 17:00  Yeast Wet Prep HPF POC NONE SEEN NONE SEEN   Trich, Wet Prep NONE SEEN NONE SEEN   Clue Cells Wet Prep HPF POC NONE SEEN PRESENTAbnormal    WBC, Wet Prep HPF POC NONE SEEN FEWAbnormal    Sperm  NONE SEEN   Comment: Performed at [facility], [address] [place], [place] [address]  [facility]ing Agency  [facility]      Specimen Collected: 03/30/19 17:00 Last Resulted: 03/30/19 17:23      Procedures: NST  Hospital Course: The patient was admitted to Labor and Delivery Triage for observation. She was placed on monitors, labs collected.  Discharge Condition: good  Disposition: Discharge disposition: 01-Home or Self Care     Diet: Regular diet  Discharge Activity: Activity as tolerated  Discharge Instructions    Discharge activity:  No Restrictions   Complete by: As directed    Discharge diet:  No restrictions   Complete by: As directed    No sexual activity restrictions   Complete by: As directed    Notify  physician for a general feeling that "something is not right"   Complete by: As directed    Notify physician for increase or change in vaginal discharge   Complete by: As directed    Notify physician for intestinal cramps, with or without diarrhea, sometimes described as "gas pain"   Complete by: As directed    Notify physician for leaking of fluid   Complete by: As directed    Notify physician for low, dull backache, unrelieved by heat or Tylenol   Complete by: As directed    Notify physician for menstrual like cramps   Complete by: As directed    Notify physician for pelvic pressure   Complete by: As directed    Notify physician for uterine contractions.  These may be painless and feel like the uterus is tightening or the baby is  "balling up"   Complete by: As directed    Notify physician for vaginal bleeding   Complete by: As directed    PRETERM LABOR:  Includes any of the follwing symptoms that occur between 20 - [redacted] weeks gestation.  If these symptoms are not stopped, preterm labor can result in preterm delivery, placing your baby at risk   Complete by: As directed      Allergies as of 03/30/2019      Reactions   Hydrocodone-acetaminophen Itching   Morphine And Related Itching   Other Swelling   SUBOXONE AND NALOXONE     Peanuts [peanut Oil] Swelling   Penicillins Hives      Medication List    STOP taking these medications   traZODone 50 MG tablet Commonly known as: DESYREL     TAKE these medications   buprenorphine  8 MG Subl SL tablet Commonly known as: SUBUTEX Place 8 mg under the tongue daily.   busPIRone 15 MG tablet Commonly known as: BUSPAR   clonazePAM 0.5 MG tablet Commonly known as: KLONOPIN   escitalopram 20 MG tablet Commonly known as: LEXAPRO   loratadine 10 MG tablet Commonly known as: CLARITIN Take 10 mg by mouth daily.   metroNIDAZOLE 0.75 % vaginal gel Commonly known as: METROGEL Place 1 Applicatorful vaginally at bedtime for 5 days.    Prenatal Vitamin Plus Low Iron 27-1 MG Tabs Take 1 tablet by mouth daily.      Follow-up Information    [facility]. Schedule an appointment as soon as possible for a visit.   Specialty: Obstetrics and Gynecology Why: for routine prenatal care Contact information: [address] [place] [address] [phone]          Total time spent taking care of this patient: 20 minutes  Signed: Tresea Mall, CNM  03/30/2019, 6:21 PM

## 2019-03-31 NOTE — Telephone Encounter (Signed)
Called and schedule patient appointment for 04/01/19 due to patient hospital follow up

## 2019-03-31 NOTE — Telephone Encounter (Signed)
Yes. Please do go ahead and schedule 28 week labs for her. Thanks

## 2019-04-01 ENCOUNTER — Encounter: Payer: Medicaid Other | Admitting: Certified Nurse Midwife

## 2019-04-01 ENCOUNTER — Other Ambulatory Visit: Payer: Medicaid Other

## 2019-04-21 ENCOUNTER — Other Ambulatory Visit: Payer: Self-pay

## 2019-04-21 ENCOUNTER — Ambulatory Visit (INDEPENDENT_AMBULATORY_CARE_PROVIDER_SITE_OTHER): Payer: Medicaid Other | Admitting: Advanced Practice Midwife

## 2019-04-21 VITALS — BP 126/70 | Wt 184.0 lb

## 2019-04-21 DIAGNOSIS — O0990 Supervision of high risk pregnancy, unspecified, unspecified trimester: Secondary | ICD-10-CM

## 2019-04-21 DIAGNOSIS — Z113 Encounter for screening for infections with a predominantly sexual mode of transmission: Secondary | ICD-10-CM

## 2019-04-21 DIAGNOSIS — Z130 Encounter for screening for diseases of the blood and blood-forming organs and certain disorders involving the immune mechanism: Secondary | ICD-10-CM

## 2019-04-21 DIAGNOSIS — Z23 Encounter for immunization: Secondary | ICD-10-CM

## 2019-04-21 DIAGNOSIS — Z3A30 30 weeks gestation of pregnancy: Secondary | ICD-10-CM

## 2019-04-21 DIAGNOSIS — Z3483 Encounter for supervision of other normal pregnancy, third trimester: Secondary | ICD-10-CM

## 2019-04-21 DIAGNOSIS — Z131 Encounter for screening for diabetes mellitus: Secondary | ICD-10-CM

## 2019-04-21 LAB — POCT URINALYSIS DIPSTICK OB
Glucose, UA: NEGATIVE
POC,PROTEIN,UA: NEGATIVE

## 2019-04-21 NOTE — Progress Notes (Signed)
Routine Prenatal Care Visit  Subjective  Tammy [name] is a 28 y.o. [phone] at [redacted]w[redacted]d being seen today for ongoing prenatal care.  She is currently monitored for the following issues for this high-risk pregnancy and has Marijuana use; S/P cesarean section; Anxiety; Supervision of high risk pregnancy, antepartum; Pregnancy complicated by subutex maintenance, antepartum ([place]); Indication for care in labor and delivery, antepartum; and BV (bacterial vaginosis) on their problem list.  ----------------------------------------------------------------------------------- Patient reports no complaints.  Patient has not been seen in the office since September. Discussed having her schedule lab only visit to do 28 wk labs. She does not want to do the lab today. Contractions: Not present. Vag. Bleeding: None.  Movement: Present. Leaking Fluid denies.  ----------------------------------------------------------------------------------- The following portions of the patient's history were reviewed and updated as appropriate: allergies, current medications, past family history, past medical history, past social history, past surgical history and problem list. Problem list updated.  Objective  Blood pressure 126/70, weight 184 lb (83.5 kg). Pregravid weight 166 lb (75.3 kg) Total Weight Gain 18 lb (8.165 kg) Urinalysis: Urine Protein Negative  Urine Glucose Negative  Fetal Status: Fetal Heart Rate (bpm): 148 Fundal Height: 30 cm Movement: Present     General:  Alert, oriented and cooperative. Patient is in no acute distress.  Skin: Skin is warm and dry. No rash noted.   Cardiovascular: Normal heart rate noted  Respiratory: Normal respiratory effort, no problems with respiration noted  Abdomen: Soft, gravid, appropriate for gestational age. Pain/Pressure: Absent     Pelvic:  Cervical exam deferred        Extremities: Normal range of motion.  Edema: None  Mental Status: Normal mood and affect. Normal  behavior. Normal judgment and thought content.   Assessment   28 y.o. [id number] at [redacted]w[redacted]d by  06/29/2019, by Ultrasound presenting for routine prenatal visit  Plan   pregnancy4 Problems (from 09/19/18 to present)    Problem Noted Resolved   BV (bacterial vaginosis) 03/30/2019 by Rod Can, CNM No   Pregnancy complicated by subutex maintenance, antepartum ([place]) 11/19/2018 by Homero Fellers, MD No   Supervision of high risk pregnancy, antepartum 11/18/2018 by Rexene Agent, CNM No   Overview Addendum 11/19/2018  1:43 PM by Homero Fellers, MD    Clinic [facility] Prenatal Labs  Dating  8 wk [place] Blood type:     Genetic Screen Declines Antibody:   Anatomic [place]  Rubella:   Varicella:    GTT  Third trimester:  RPR:     Rhogam  HBsAg:     TDaP vaccine                       Flu Shot: HIV:     Baby Food                                GBS:   Contraception  Pap: 2020  CBB     CS/VBAC    Support Person              S/P cesarean section 02/16/2016 by Rubie Maid, MD No   Overview Signed 02/18/2019 11:54 AM by Will Bonnet, MD    - Op note from [place] states that c section due to fetal intolerance - OP note from second c-section states that 2nd c-section was elective and that first c-section was due to failure to progress. - The first op  note is more likely to be accurate. - So, she would likely be a reasonable candidate for a TOLAC but she will need a full review of the risks an benefits of TOLAC after 2 c-sections [ ]  Review risks/benefits of TOLAC      Marijuana use 07/26/2015 by 09/25/2015, CNM No       Preterm labor symptoms and general obstetric precautions including but not limited to vaginal bleeding, contractions, leaking of fluid and fetal movement were reviewed in detail with the patient. Please refer to After Visit Summary for other counseling recommendations.   Repeat c/section is scheduled for 06/23/19  Return in about 2 weeks (around 05/05/2019) for 28 wk  labs lab only visit asap and 2 wk rob.  05/07/2019, CNM 04/21/2019 3:12 PM

## 2019-04-21 NOTE — Progress Notes (Signed)
ROB TDAP given 

## 2019-04-23 ENCOUNTER — Telehealth: Payer: Self-pay | Admitting: Obstetrics & Gynecology

## 2019-04-23 NOTE — Telephone Encounter (Signed)
Lmtrc

## 2019-04-23 NOTE — Telephone Encounter (Signed)
-----   Message from Rod Can, CNM sent at 04/21/2019  4:50 PM EST ----- Surgery Booking Request Patient Full Name:  KARLYN GLASCO  MRN: [id number]  DOB: 1990/12/28  Surgeon: not sure who is on that day Requested Surgery Date and Time: 06/23/2019 Primary Diagnosis AND Code: repeat c/section Secondary Diagnosis and Code:  Surgical Procedure: Cesarean Section L&D Notification: Yes Admission Status: surgery admit Length of Surgery: 62 m Special Case Needs: Yes On Q pump H&P:  Phone Interview???:   Interpreter: No Language: English Medical Clearance:  Special Scheduling Instructions:  Any known health/anesthesia issues, diabetes, sleep apnea, latex allergy, defibrillator/pacemaker?:  Acuity: P1   (P1 highest, P2 delay may cause harm, P3 low, elective gyn, P4 lowest)

## 2019-04-27 ENCOUNTER — Ambulatory Visit: Payer: Self-pay | Admitting: *Deleted

## 2019-04-27 ENCOUNTER — Telehealth: Payer: Self-pay

## 2019-04-27 NOTE — Telephone Encounter (Signed)
Patient called and wanted advice on medication she can tale for allergy symptoms.  She is pregnant. She has notice the stuffy nose since getting a kitten. She states she has called her OB GYN. They will not answer. Patient was advised to stay away from the cat and call her PCP listed as [facility].  She verbalized understanding and will place a call to them for medication advice.  Reason for Disposition . [1] Caller has URGENT medication question about med that PCP or specialist prescribed AND [2] triager unable to answer question  Answer Assessment - Initial Assessment Questions 1.   NAME of MEDICATION: "What medicine are you calling about?"     Need advice on medication to take for allergy symptoms while pregant 2.   QUESTION: "What is your question?"     What can I take 3.   PRESCRIBING HCP: "Who prescribed it?" Reason: if prescribed by specialist, call should be referred to that group.     *No Answer* 4. SYMPTOMS: "Do you have any symptoms?"    Stuffy nose 5. SEVERITY: If symptoms are present, ask "Are they mild, moderate or severe?"    Stuffy nose 6.  PREGNANCY:  "Is there any chance that you are pregnant?" "When was your last menstrual period?"     yes  Protocols used: MEDICATION QUESTION CALL-A-AH

## 2019-04-27 NOTE — Telephone Encounter (Signed)
Pt calling; not feeling good; sinuses, stopped up nose; is at the pharm; about to get benadryl allergy.  [phone]  Left detailed msg to call; I have a list of safe meds I can give her; benadryl itself is okay for short term; not sure about benadryl allergy.

## 2019-04-28 NOTE — Telephone Encounter (Signed)
Lmtrc

## 2019-04-28 NOTE — Telephone Encounter (Signed)
Called pt; adv may take plain sudafed, sip hot beverages/soup (watch caffeine intake), saline nasal spray.  Pt states she is taking benadryl which seems to help.  Also, pt would like her c/s to be moved to Feb 7th or 8th because she will be incarcerated in the [facility] for 45d starting 05/01/19.  Adv to discuss at next visit and will let [place] know.

## 2019-04-30 NOTE — Telephone Encounter (Signed)
Change to feb 9 as that is next surgery date available after feb 7 Will discuss when we can  Sch visit prior to incarceration (?)

## 2019-05-01 NOTE — Telephone Encounter (Signed)
Lmtrc

## 2019-05-07 NOTE — Telephone Encounter (Signed)
06-23-19 is fine

## 2019-05-07 NOTE — Telephone Encounter (Signed)
Lmtrc

## 2019-05-07 NOTE — Telephone Encounter (Signed)
Patient returned the call and does not have to go to jail, will have probation instead. Which date should the c/s be scheduled?

## 2019-05-12 ENCOUNTER — Other Ambulatory Visit: Payer: Medicaid Other

## 2019-05-12 ENCOUNTER — Other Ambulatory Visit: Payer: Self-pay

## 2019-05-12 ENCOUNTER — Ambulatory Visit (INDEPENDENT_AMBULATORY_CARE_PROVIDER_SITE_OTHER): Payer: Medicaid Other | Admitting: Obstetrics & Gynecology

## 2019-05-12 ENCOUNTER — Encounter: Payer: Self-pay | Admitting: Obstetrics & Gynecology

## 2019-05-12 VITALS — BP 120/80 | Wt 182.0 lb

## 2019-05-12 DIAGNOSIS — Z131 Encounter for screening for diabetes mellitus: Secondary | ICD-10-CM

## 2019-05-12 DIAGNOSIS — O0990 Supervision of high risk pregnancy, unspecified, unspecified trimester: Secondary | ICD-10-CM

## 2019-05-12 DIAGNOSIS — O99320 Drug use complicating pregnancy, unspecified trimester: Secondary | ICD-10-CM

## 2019-05-12 DIAGNOSIS — O99323 Drug use complicating pregnancy, third trimester: Secondary | ICD-10-CM

## 2019-05-12 DIAGNOSIS — O34219 Maternal care for unspecified type scar from previous cesarean delivery: Secondary | ICD-10-CM

## 2019-05-12 DIAGNOSIS — Z98891 History of uterine scar from previous surgery: Secondary | ICD-10-CM

## 2019-05-12 DIAGNOSIS — F1120 Opioid dependence, uncomplicated: Secondary | ICD-10-CM

## 2019-05-12 DIAGNOSIS — O0993 Supervision of high risk pregnancy, unspecified, third trimester: Secondary | ICD-10-CM

## 2019-05-12 DIAGNOSIS — Z130 Encounter for screening for diseases of the blood and blood-forming organs and certain disorders involving the immune mechanism: Secondary | ICD-10-CM

## 2019-05-12 DIAGNOSIS — Z113 Encounter for screening for infections with a predominantly sexual mode of transmission: Secondary | ICD-10-CM

## 2019-05-12 DIAGNOSIS — Z3A33 33 weeks gestation of pregnancy: Secondary | ICD-10-CM

## 2019-05-12 LAB — POCT URINALYSIS DIPSTICK OB
Glucose, UA: NEGATIVE
POC,PROTEIN,UA: NEGATIVE

## 2019-05-12 MED ORDER — ESOMEPRAZOLE MAGNESIUM 20 MG PO CPDR: 20.0000 mg | DELAYED_RELEASE_CAPSULE | Freq: Every day | ORAL | 2 refills | Status: DC

## 2019-05-12 MED ORDER — CITRANATAL BLOOM 90-1 MG PO TABS: 1.0000 | ORAL_TABLET | Freq: Every day | ORAL | 6 refills | Status: DC

## 2019-05-12 NOTE — Telephone Encounter (Signed)
Scheduled while the patient was in the office for an appointment. Printed reminder of H&P appointment was given to the patient. Patient is aware she will be scheduled for a Pre-admit testing appointment, and will have COVID testing on 1/29 @ 9-10am.

## 2019-05-12 NOTE — Patient Instructions (Signed)
Braxton Hicks Contractions Contractions of the uterus can occur throughout pregnancy, but they are not always a sign that you are in labor. You may have practice contractions called Braxton Hicks contractions. These false labor contractions are sometimes confused with true labor. What are Braxton Hicks contractions? Braxton Hicks contractions are tightening movements that occur in the muscles of the uterus before labor. Unlike true labor contractions, these contractions do not result in opening (dilation) and thinning of the cervix. Toward the end of pregnancy (32-34 weeks), Braxton Hicks contractions can happen more often and may become stronger. These contractions are sometimes difficult to tell apart from true labor because they can be very uncomfortable. You should not feel embarrassed if you go to the hospital with false labor. Sometimes, the only way to tell if you are in true labor is for your health care provider to look for changes in the cervix. The health care provider will do a physical exam and may monitor your contractions. If you are not in true labor, the exam should show that your cervix is not dilating and your water has not broken. If there are no other health problems associated with your pregnancy, it is completely safe for you to be sent home with false labor. You may continue to have Braxton Hicks contractions until you go into true labor. How to tell the difference between true labor and false labor True labor  Contractions last 30-70 seconds.  Contractions become very regular.  Discomfort is usually felt in the top of the uterus, and it spreads to the lower abdomen and low back.  Contractions do not go away with walking.  Contractions usually become more intense and increase in frequency.  The cervix dilates and gets thinner. False labor  Contractions are usually shorter and not as strong as true labor contractions.  Contractions are usually irregular.  Contractions  are often felt in the front of the lower abdomen and in the groin.  Contractions may go away when you walk around or change positions while lying down.  Contractions get weaker and are shorter-lasting as time goes on.  The cervix usually does not dilate or become thin. Follow these instructions at home:   Take over-the-counter and prescription medicines only as told by your health care provider.  Keep up with your usual exercises and follow other instructions from your health care provider.  Eat and drink lightly if you think you are going into labor.  If Braxton Hicks contractions are making you uncomfortable: ? Change your position from lying down or resting to walking, or change from walking to resting. ? Sit and rest in a tub of warm water. ? Drink enough fluid to keep your urine pale yellow. Dehydration may cause these contractions. ? Do slow and deep breathing several times an hour.  Keep all follow-up prenatal visits as told by your health care provider. This is important. Contact a health care provider if:  You have a fever.  You have continuous pain in your abdomen. Get help right away if:  Your contractions become stronger, more regular, and closer together.  You have fluid leaking or gushing from your vagina.  You pass blood-tinged mucus (bloody show).  You have bleeding from your vagina.  You have low back pain that you never had before.  You feel your baby's head pushing down and causing pelvic pressure.  Your baby is not moving inside you as much as it used to. Summary  Contractions that occur before labor are   called Braxton Hicks contractions, false labor, or practice contractions.  Braxton Hicks contractions are usually shorter, weaker, farther apart, and less regular than true labor contractions. True labor contractions usually become progressively stronger and regular, and they become more frequent.  Manage discomfort from Braxton Hicks contractions  by changing position, resting in a warm bath, drinking plenty of water, or practicing deep breathing. This information is not intended to replace advice given to you by your health care provider. Make sure you discuss any questions you have with your health care provider. Document Released: 09/20/2016 Document Revised: 04/19/2017 Document Reviewed: 09/20/2016 Elsevier Patient Education  2020 [employer]  

## 2019-05-12 NOTE — Addendum Note (Signed)
Addended by: Gae Dry on: 05/12/2019 03:27 PM   Modules accepted: Orders

## 2019-05-12 NOTE — Progress Notes (Signed)
Subjective  Fetal Movement? yes Contractions? no Leaking Fluid? no Vaginal Bleeding? no  Objective  BP 120/80   Wt 182 lb (82.6 kg)   LMP  (LMP Unknown)   BMI 31.24 kg/m  General: NAD Pumonary: no increased work of breathing Abdomen: gravid, non-tender Extremities: no edema Psychiatric: mood appropriate, affect full  Assessment  28 y.o. [id number] at [redacted]w[redacted]d by  06/29/2019, by Ultrasound presenting for routine prenatal visit  Plan   Problem List Items Addressed This Visit      Other   S/P cesarean section   Supervision of high risk pregnancy, antepartum   Pregnancy complicated by subutex maintenance, antepartum ([place])    Other Visit Diagnoses    [redacted] weeks gestation of pregnancy    -  Primary   Relevant Orders   POC Urinalysis Dipstick OB (Completed)    Glucola today Plans R CS 06/23/19    VBAC discussed.  Considering if natural labor OB/GYN  Counseling Note  28 y.o. [id number] at [redacted]w[redacted]d with Estimated Date of Delivery: 06/29/19 was seen today in office to discuss trial of labor after cesarean section (TOLAC) versus elective repeat cesarean delivery (ERCD). The following risks were discussed with the patient.  Risk of uterine rupture at term is 0.78 percent with TOLAC and 0.22 percent with ERCD. 1 in 10 uterine ruptures will result in neonatal death or neurological injury. The benefits of a trial of labor after cesarean (TOLAC) resulting in a vaginal birth after cesarean (VBAC) include the following: shorter length of hospital stay and postpartum recovery (in most cases); fewer complications, such as postpartum fever, wound or uterine infection, thromboembolism (blood clots in the leg or lung), need for blood transfusion and fewer neonatal breathing problems.  The risks of an attempted VBAC or TOLAC include the following: Risk of failed trial of labor after cesarean (TOLAC) without a vaginal birth after cesarean (VBAC) resulting in repeat cesarean delivery (RCD) in about 20 to 81  percent of women who attempt VBAC.  Risk of rupture of uterus resulting in an emergency cesarean delivery. The risk of uterine rupture may be related in part to the type of uterine incision made during the first cesarean delivery. A previous transverse uterine incision has the lowest risk of rupture (0.2 to 1.5 percent risk). Vertical or T-shaped uterine incisions have a higher risk of uterine rupture (4 to 9 percent risk)The risk of fetal death is very low with both VBAC and elective repeat cesarean delivery (ERCD), but the likelihood of fetal death is higher with VBAC than with ERCD. Maternal death is very rare with either type of delivery.  The risks of an elective repeat cesarean delivery (ERCD) were reviewed with the patient including but not limited to: 06/998 risk of uterine rupture which could have serious consequences, bleeding which may require transfusion; infection which may require antibiotics; injury to bowel, bladder or other surrounding organs (bowel, bladder, ureters); injury to the fetus; need for additional procedures including hysterectomy in the event of a life-threatening hemorrhage; thromboembolic phenomenon; abnormal placentation; incisional problems; death and other postoperative or anesthesia complications.      pregnancy4 Problems (from 09/19/18 to present)    Problem Noted Resolved   BV (bacterial vaginosis)  No   Pregnancy complicated by subutex maintenance, antepartum ([place]) Cont to take as prescribed No   Supervision of high risk pregnancy, antepartum  No   Overview Addendum 05/12/2019  2:40 PM by Gae Dry, MD    [facility] Prenatal Labs  Dating  8 wk [place] Blood type: A/Positive/-- (06/30 1351)   Genetic Screen Declines Antibody:Negative (06/30 1351)  Anatomic [place] [facility]SOB Rubella: 2.07 (06/30 1351) Varicella:    GTT  Third trimester:  RPR: Non Reactive (06/30 1351)   Rhogam na HBsAg: Negative (06/30 1351)   TDaP vaccine       12/20  Flu Shot:decline HIV: Non  Reactive (06/30 1351)   Baby Food             Br                   GBS:   Contraception Nex Pap: 2020  CBB  No   CS/VBAC CS 06/23/19   Support Person              S/P cesarean section  No   Overview Signed 02/18/2019 11:54 AM by Conard Novak, MD    - Op note from G1 states that c section due to fetal intolerance - OP note from second c-section states that 2nd c-section was elective and that first c-section was due to failure to progress. - The first op note is more likely to be accurate. - So, she would likely be a reasonable candidate for a TOLAC but she will need a full review of the risks an benefits of TOLAC after 2 c-sections [x ] Review risks/benefits of TOLAC      Marijuana use  No       Annamarie Major, MD, [facility], [facility] 05/12/2019  2:40 PM

## 2019-05-13 LAB — 28 WEEK RH+PANEL
Basophils Absolute: 0 10*3/uL (ref 0.0–0.2)
Basos: 0 %
EOS (ABSOLUTE): 0.5 10*3/uL — ABNORMAL HIGH (ref 0.0–0.4)
Eos: 5 %
Gestational Diabetes Screen: 102 mg/dL (ref 65–139)
HIV Screen 4th Generation wRfx: NONREACTIVE
Hematocrit: 37.8 % (ref 34.0–46.6)
Hemoglobin: 13 g/dL (ref 11.1–15.9)
Immature Grans (Abs): 0 10*3/uL (ref 0.0–0.1)
Immature Granulocytes: 0 %
Lymphocytes Absolute: 1.4 10*3/uL (ref 0.7–3.1)
Lymphs: 14 %
MCH: 31.8 pg (ref 26.6–33.0)
MCHC: 34.4 g/dL (ref 31.5–35.7)
MCV: 92 fL (ref 79–97)
Monocytes Absolute: 0.5 10*3/uL (ref 0.1–0.9)
Monocytes: 5 %
Neutrophils Absolute: 7.6 10*3/uL — ABNORMAL HIGH (ref 1.4–7.0)
Neutrophils: 76 %
Platelets: 216 10*3/uL (ref 150–450)
RBC: 4.09 x10E6/uL (ref 3.77–5.28)
RDW: 12.5 % (ref 11.7–15.4)
RPR Ser Ql: NONREACTIVE
WBC: 10 10*3/uL (ref 3.4–10.8)

## 2019-05-26 ENCOUNTER — Ambulatory Visit (INDEPENDENT_AMBULATORY_CARE_PROVIDER_SITE_OTHER): Payer: Medicaid Other | Admitting: Obstetrics & Gynecology

## 2019-05-26 ENCOUNTER — Other Ambulatory Visit: Payer: Self-pay

## 2019-05-26 VITALS — BP 102/60 | Wt 184.0 lb

## 2019-05-26 DIAGNOSIS — Z3A35 35 weeks gestation of pregnancy: Secondary | ICD-10-CM

## 2019-05-26 DIAGNOSIS — O99323 Drug use complicating pregnancy, third trimester: Secondary | ICD-10-CM

## 2019-05-26 DIAGNOSIS — O0990 Supervision of high risk pregnancy, unspecified, unspecified trimester: Secondary | ICD-10-CM

## 2019-05-26 DIAGNOSIS — O34219 Maternal care for unspecified type scar from previous cesarean delivery: Secondary | ICD-10-CM

## 2019-05-26 DIAGNOSIS — O99320 Drug use complicating pregnancy, unspecified trimester: Secondary | ICD-10-CM

## 2019-05-26 DIAGNOSIS — F1120 Opioid dependence, uncomplicated: Secondary | ICD-10-CM

## 2019-05-26 DIAGNOSIS — Z98891 History of uterine scar from previous surgery: Secondary | ICD-10-CM

## 2019-05-26 DIAGNOSIS — O0993 Supervision of high risk pregnancy, unspecified, third trimester: Secondary | ICD-10-CM

## 2019-05-26 LAB — POCT URINALYSIS DIPSTICK OB
Glucose, UA: NEGATIVE
POC,PROTEIN,UA: NEGATIVE

## 2019-05-26 NOTE — Addendum Note (Signed)
Addended by: Cornelius Moras D on: 05/26/2019 04:45 PM   Modules accepted: Orders

## 2019-05-26 NOTE — Patient Instructions (Signed)
Group B Streptococcus Infection During Pregnancy Due next week  Group B Streptococcus (GBS) is a type of bacteria that is often found in healthy people. It is commonly found in the rectum, vagina, and intestines. In people who are healthy and not pregnant, the bacteria rarely cause serious illness or complications. However, women who test positive for GBS during pregnancy can pass the bacteria to the baby during childbirth. This can cause serious infection in the baby after birth. Women with GBS may also have infections during their pregnancy or soon after childbirth. The infections include urinary tract infections (UTIs) or infections of the uterus. GBS also increases a woman's risk of complications during pregnancy, such as early labor or delivery, miscarriage, or stillbirth. Routine testing for GBS is recommended for all pregnant women. What are the causes? This condition is caused by bacteria called Streptococcus agalactiae. What increases the risk? You may have a higher risk for GBS infection during pregnancy if you had one during a past pregnancy. What are the signs or symptoms? In most cases, GBS infection does not cause symptoms in pregnant women. If symptoms exist, they may include:  Labor that starts before the 37th week of pregnancy.  A UTI or bladder infection. This may cause a fever, frequent urination, or pain and burning during urination.  Fever during labor. There can also be a rapid heartbeat in the mother or baby. Rare but serious symptoms of a GBS infection in women include:  Blood infection (septicemia). This may cause fever, chills, or confusion.  Lung infection (pneumonia). This may cause fever, chills, cough, rapid breathing, chest pain, or difficulty breathing.  Bone, joint, skin, or soft tissue infection. How is this diagnosed? You may be screened for GBS between week 35 and week 37 of pregnancy. If you have symptoms of preterm labor, you may be screened earlier.  This condition is diagnosed based on lab test results from:  A swab of fluid from the vagina and rectum.  A urine sample. How is this treated? This condition is treated with antibiotic medicine. Antibiotic medicine may be given:  To you when you go into labor, or as soon as your water breaks. The medicines will continue until after you give birth. If you are having a cesarean delivery, you do not need antibiotics unless your water has broken.  To your baby, if he or she requires treatment. Your health care provider will check your baby to decide if he or she needs antibiotics to prevent a serious infection. Follow these instructions at home:  Take over-the-counter and prescription medicines only as told by your health care provider.  Take your antibiotic medicine as told by your health care provider. Do not stop taking the antibiotic even if you start to feel better.  Keep all pre-birth (prenatal) visits and follow-up visits as told by your health care provider. This is important. Contact a health care provider if:  You have pain or burning when you urinate.  You have to urinate more often than usual.  You have a fever or chills.  You develop a bad-smelling vaginal discharge. Get help right away if:  Your water breaks.  You go into labor.  You have severe pain in your abdomen.  You have difficulty breathing.  You have chest pain. These symptoms may represent a serious problem that is an emergency. Do not wait to see if the symptoms will go away. Get medical help right away. Call your local emergency services (911 in the [place] Do  not drive yourself to the hospital. Summary  GBS is a type of bacteria that is common in healthy people.  During pregnancy, colonization with GBS can cause serious complications for you or your baby.  Your health care provider will screen you between 35 and 37 weeks of pregnancy to determine if you are colonized with GBS.  If you are colonized  with GBS during pregnancy, your health care provider will recommend antibiotics through an IV during labor.  After delivery, your baby will be evaluated for complications related to potential GBS infection and may require antibiotics to prevent a serious infection. This information is not intended to replace advice given to you by your health care provider. Make sure you discuss any questions you have with your health care provider. Document Revised: 12/01/2018 Document Reviewed: 12/01/2018 Elsevier Patient Education  2020 [employer].

## 2019-05-26 NOTE — Progress Notes (Signed)
  Subjective  Fetal Movement? yes Contractions? no Leaking Fluid? no Vaginal Bleeding? no  Objective  BP 102/60   Wt 184 lb (83.5 kg)   LMP  (LMP Unknown)   BMI 31.58 kg/m  General: NAD Pumonary: no increased work of breathing Abdomen: gravid, non-tender Extremities: no edema Psychiatric: mood appropriate, affect full  Assessment  29 y.o. [id number] at [redacted]w[redacted]d by  06/29/2019, by Ultrasound presenting for routine prenatal visit  Plan   Problem List Items Addressed This Visit      Other   S/P cesarean section   Supervision of high risk pregnancy, antepartum   Pregnancy complicated by subutex maintenance, antepartum ([facility])    Other Visit Diagnoses    [redacted] weeks gestation of pregnancy    -  Primary      pregnancy4 Problems (from 09/19/18 to present)    Problem Noted Resolved   BV (bacterial vaginosis) 03/30/2019 by Tresea Mall, CNM No   Pregnancy complicated by subutex maintenance, antepartum ([facility]) 11/19/2018 by Natale Milch, MD No   Supervision of high risk pregnancy, antepartum 11/18/2018 by Oswaldo Conroy, CNM No   Overview Addendum 05/12/2019  2:40 PM by Nadara Mustard, MD    Clinic [facility] Prenatal Labs  Dating  8 wk [place] Blood type: A/Positive/-- (06/30 1351)   Genetic Screen Declines Antibody:Negative (06/30 1351)  Anatomic [place] WSOB Rubella: 2.07 (06/30 1351) Varicella:    GTT  Third trimester:  RPR: Non Reactive (06/30 1351)   Rhogam na HBsAg: Negative (06/30 1351)   TDaP vaccine       12/20  Flu Shot:decline HIV: Non Reactive (06/30 1351)   Baby Food             Br                   GBS:   Contraception Nex Pap: 2020  CBB  No   CS/VBAC CS 06/23/19   Support Person              S/P cesarean section 02/16/2016 by Hildred Laser, MD No   Overview Signed 02/18/2019 11:54 AM by Conard Novak, MD    - Op note from G1 states that c section due to fetal intolerance - OP note from second c-section states that 2nd c-section was elective and that first c-section  was due to failure to progress. - The first op note is more likely to be accurate. - So, she would likely be a reasonable candidate for a TOLAC but she will need a full review of the risks an benefits of TOLAC after 2 c-sections [ ]  Review risks/benefits of TOLAC      Marijuana use 07/26/2015 by 09/25/2015, CNM No     CS 06/23/2019, OK to TOLAC prior to that if labors  08/21/2019, MD, Annamarie Major Ob/Gyn, [facility] 05/26/2019  4:31 PM

## 2019-06-03 ENCOUNTER — Ambulatory Visit (INDEPENDENT_AMBULATORY_CARE_PROVIDER_SITE_OTHER): Payer: Medicaid Other | Admitting: Advanced Practice Midwife

## 2019-06-03 ENCOUNTER — Other Ambulatory Visit (HOSPITAL_COMMUNITY)
Admission: RE | Admit: 2019-06-03 | Discharge: 2019-06-03 | Disposition: A | Discharge status: Home / Self Care | Payer: Medicaid Other | Source: Ambulatory Visit | Attending: Advanced Practice Midwife | Admitting: Advanced Practice Midwife

## 2019-06-03 ENCOUNTER — Other Ambulatory Visit: Payer: Self-pay

## 2019-06-03 ENCOUNTER — Encounter: Payer: Self-pay | Admitting: Advanced Practice Midwife

## 2019-06-03 VITALS — BP 120/80 | Wt 189.0 lb

## 2019-06-03 DIAGNOSIS — O34219 Maternal care for unspecified type scar from previous cesarean delivery: Secondary | ICD-10-CM

## 2019-06-03 DIAGNOSIS — Z3A36 36 weeks gestation of pregnancy: Secondary | ICD-10-CM | POA: Insufficient documentation

## 2019-06-03 DIAGNOSIS — Z113 Encounter for screening for infections with a predominantly sexual mode of transmission: Secondary | ICD-10-CM

## 2019-06-03 DIAGNOSIS — Z3685 Encounter for antenatal screening for Streptococcus B: Secondary | ICD-10-CM

## 2019-06-03 NOTE — Progress Notes (Signed)
Routine Prenatal Care Visit  Subjective  Tammy [name] is a 29 y.o. [phone] at [redacted]w[redacted]d being seen today for ongoing prenatal care.  She is currently monitored for the following issues for this high-risk pregnancy and has Marijuana use; S/P cesarean section; Anxiety; Supervision of high risk pregnancy, antepartum; Pregnancy complicated by subutex maintenance, antepartum ([facility]); Indication for care in labor and delivery, antepartum; and BV (bacterial vaginosis) on their problem list.  ----------------------------------------------------------------------------------- Patient reports no complaints.   Contractions: Not present. Vag. Bleeding: None.  Movement: Present. Leaking Fluid denies.  ----------------------------------------------------------------------------------- The following portions of the patient's history were reviewed and updated as appropriate: allergies, current medications, past family history, past medical history, past social history, past surgical history and problem list. Problem list updated.  Objective  Blood pressure 120/80, weight 189 lb (85.7 kg). Pregravid weight 166 lb (75.3 kg) Total Weight Gain 23 lb (10.4 kg) Urinalysis: Urine Protein    Urine Glucose    Fetal Status: Fetal Heart Rate (bpm): 128 Fundal Height: 36 cm Movement: Present     General:  Alert, oriented and cooperative. Patient is in no acute distress.  Skin: Skin is warm and dry. No rash noted.   Cardiovascular: Normal heart rate noted  Respiratory: Normal respiratory effort, no problems with respiration noted  Abdomen: Soft, gravid, appropriate for gestational age. Pain/Pressure: Present     Pelvic:  Cervical exam performed Dilation: Closed    GBS/aptima collected  Extremities: Normal range of motion.  Edema: None  Mental Status: Normal mood and affect. Normal behavior. Normal judgment and thought content.   Assessment   29 y.o. [id number] at [redacted]w[redacted]d by  06/29/2019, by Ultrasound presenting for  routine prenatal visit  Plan   pregnancy4 Problems (from 09/19/18 to present)    Problem Noted Resolved   BV (bacterial vaginosis) 03/30/2019 by Tresea Mall, CNM No   Pregnancy complicated by subutex maintenance, antepartum ([facility]) 11/19/2018 by Natale Milch, MD No   Supervision of high risk pregnancy, antepartum 11/18/2018 by Oswaldo Conroy, CNM No   Overview Addendum 05/12/2019  2:40 PM by Nadara Mustard, MD    [facility] Prenatal Labs  Dating  8 wk [place] Blood type: A/Positive/-- (06/30 1351)   Genetic Screen Declines Antibody:Negative (06/30 1351)  Anatomic [place] WSOB Rubella: 2.07 (06/30 1351) Varicella:    GTT  Third trimester:  RPR: Non Reactive (06/30 1351)   Rhogam na HBsAg: Negative (06/30 1351)   TDaP vaccine       12/20  Flu Shot:decline HIV: Non Reactive (06/30 1351)   Baby Food             Br                   GBS:   Contraception Nex Pap: 2020  CBB  No   CS/VBAC CS 06/23/19   Support Person              S/P cesarean section 02/16/2016 by Hildred Laser, MD No   Overview Signed 02/18/2019 11:54 AM by Conard Novak, MD    - Op note from G1 states that c section due to fetal intolerance - OP note from second c-section states that 2nd c-section was elective and that first c-section was due to failure to progress. - The first op note is more likely to be accurate. - So, she would likely be a reasonable candidate for a TOLAC but she will need a full review of the risks an benefits of  TOLAC after 2 c-sections [ ]  Review risks/benefits of TOLAC      Marijuana use 07/26/2015 by Joylene Igo, CNM No       Preterm labor symptoms and general obstetric precautions including but not limited to vaginal bleeding, contractions, leaking of fluid and fetal movement were reviewed in detail with the patient.   Return in about 1 week (around 06/10/2019) for rob.  Rod Can, CNM 06/03/2019 3:45 PM

## 2019-06-04 ENCOUNTER — Other Ambulatory Visit: Payer: Self-pay | Admitting: Obstetrics & Gynecology

## 2019-06-04 MED ORDER — CITRANATAL ASSURE 35-1 & 300 MG PO MISC: 2.0000 | Freq: Every day | ORAL | 6 refills | Status: AC

## 2019-06-05 LAB — CERVICOVAGINAL ANCILLARY ONLY
Chlamydia: NEGATIVE
Comment: NEGATIVE
Comment: NEGATIVE
Comment: NORMAL
Neisseria Gonorrhea: NEGATIVE
Trichomonas: NEGATIVE

## 2019-06-08 LAB — STREP GP B CULTURE+RFLX: Strep Gp B Culture+Rflx: POSITIVE — AB

## 2019-06-08 LAB — STREP GP B SUSCEPTIBILITY

## 2019-06-11 ENCOUNTER — Telehealth: Payer: Self-pay

## 2019-06-11 NOTE — Telephone Encounter (Signed)
Pt calling; wants a call back; she changed behavioral health doctors; they put her on clonopin instead of xanax ([employer] ones); states clonopin isn't working for her; she is having seizures and has been scared to tell anybody; on xanax until 2m preg.  [phone]

## 2019-06-11 NOTE — Telephone Encounter (Signed)
I briefly spoke with the patient by phone and then we were cut off and I was unable to reach her again. It was very difficult to understand what she was telling me as she was talking so fast- basically that her xanax was changed to klonopin which isn't working for her. I cannot figure out from her chart who changed the medication. I did suggest she needs to talk with that doctor since they are the ones that changed the medication.   Can you have one of the doctors give her a call to discuss the best plan.

## 2019-06-15 ENCOUNTER — Ambulatory Visit (INDEPENDENT_AMBULATORY_CARE_PROVIDER_SITE_OTHER): Payer: Medicaid Other | Admitting: Obstetrics & Gynecology

## 2019-06-15 ENCOUNTER — Encounter: Payer: Self-pay | Admitting: Obstetrics & Gynecology

## 2019-06-15 ENCOUNTER — Other Ambulatory Visit: Payer: Self-pay

## 2019-06-15 VITALS — BP 120/80 | Ht 64.0 in | Wt 194.0 lb

## 2019-06-15 DIAGNOSIS — O34219 Maternal care for unspecified type scar from previous cesarean delivery: Secondary | ICD-10-CM

## 2019-06-15 DIAGNOSIS — O0990 Supervision of high risk pregnancy, unspecified, unspecified trimester: Secondary | ICD-10-CM

## 2019-06-15 DIAGNOSIS — Z98891 History of uterine scar from previous surgery: Secondary | ICD-10-CM

## 2019-06-15 DIAGNOSIS — O99320 Drug use complicating pregnancy, unspecified trimester: Secondary | ICD-10-CM

## 2019-06-15 DIAGNOSIS — Z3A38 38 weeks gestation of pregnancy: Secondary | ICD-10-CM

## 2019-06-15 DIAGNOSIS — O0993 Supervision of high risk pregnancy, unspecified, third trimester: Secondary | ICD-10-CM

## 2019-06-15 DIAGNOSIS — F1120 Opioid dependence, uncomplicated: Secondary | ICD-10-CM

## 2019-06-15 DIAGNOSIS — O99323 Drug use complicating pregnancy, third trimester: Secondary | ICD-10-CM

## 2019-06-15 NOTE — Patient Instructions (Signed)
PRE ADMISSION TESTING For Covid, prior to procedure Friday 9:00-10:00 [facility] entrance (drive up)  Results in 48-72 hours You will not receive notification if test results are negative. If positive for Covid19, your provider will notify you by phone, with additional instructions.    Cesarean Delivery, Care After This sheet gives you information about how to care for yourself after your procedure. Your health care provider may also give you more specific instructions. If you have problems or questions, contact your health care provider. What can I expect after the procedure? After the procedure, it is common to have:  A small amount of blood or clear fluid coming from the incision.  Some redness, swelling, and pain in your incision area.  Some abdominal pain and soreness.  Vaginal bleeding (lochia). Even though you did not have a vaginal delivery, you will still have vaginal bleeding and discharge.  Pelvic cramps.  Fatigue. You may have pain, swelling, and discomfort in the tissue between your vagina and your anus (perineum) if:  Your C-section was unplanned, and you were allowed to labor and push.  An incision was made in the area (episiotomy) or the tissue tore during attempted vaginal delivery. Follow these instructions at home: Incision care   Follow instructions from your health care provider about how to take care of your incision. Make sure you: ? Wash your hands with soap and water before you change your bandage (dressing). If soap and water are not available, use hand sanitizer. ? If you have a dressing, change it or remove it as told by your health care provider. ? Leave stitches (sutures), skin staples, skin glue, or adhesive strips in place. These skin closures may need to stay in place for 2 weeks or longer. If adhesive strip edges start to loosen and curl up, you may trim the loose edges. Do not remove adhesive strips completely unless your health  care provider tells you to do that.  Check your incision area every day for signs of infection. Check for: ? More redness, swelling, or pain. ? More fluid or blood. ? Warmth. ? Pus or a bad smell.  Do not take baths, swim, or use a hot tub until your health care provider says it's okay. Ask your health care provider if you can take showers.  When you cough or sneeze, hug a pillow. This helps with pain and decreases the chance of your incision opening up (dehiscing). Do this until your incision heals. Medicines  Take over-the-counter and prescription medicines only as told by your health care provider.  If you were prescribed an antibiotic medicine, take it as told by your health care provider. Do not stop taking the antibiotic even if you start to feel better.  Do not drive or use heavy machinery while taking prescription pain medicine. Lifestyle  Do not drink alcohol. This is especially important if you are breastfeeding or taking pain medicine.  Do not use any products that contain nicotine or tobacco, such as cigarettes, e-cigarettes, and chewing tobacco. If you need help quitting, ask your health care provider. Eating and drinking  Drink at least 8 eight-ounce glasses of water every day unless told not to by your health care provider. If you breastfeed, you may need to drink even more water.  Eat high-fiber foods every day. These foods may help prevent or relieve constipation. High-fiber foods include: ? Whole grain cereals and breads. ? Brown rice. ? Beans. ? Fresh fruits and vegetables. Activity   If  possible, have someone help you care for your baby and help with household activities for at least a few days after you leave the hospital.  Return to your normal activities as told by your health care provider. Ask your health care provider what activities are safe for you.  Rest as much as possible. Try to rest or take a nap while your baby is sleeping.  Do not lift  anything that is heavier than 10 lbs (4.5 kg), or the limit that you were told, until your health care provider says that it is safe.  Talk with your health care provider about when you can engage in sexual activity. This may depend on your: ? Risk of infection. ? How fast you heal. ? Comfort and desire to engage in sexual activity. General instructions  Do not use tampons or douches until your health care provider approves.  Wear loose, comfortable clothing and a supportive and well-fitting bra.  Keep your perineum clean and dry. Wipe from front to back when you use the toilet.  If you pass a blood clot, save it and call your health care provider to discuss. Do not flush blood clots down the toilet before you get instructions from your health care provider.  Keep all follow-up visits for you and your baby as told by your health care provider. This is important. Contact a health care provider if:  You have: ? A fever. ? Bad-smelling vaginal discharge. ? Pus or a bad smell coming from your incision. ? Difficulty or pain when urinating. ? A sudden increase or decrease in the frequency of your bowel movements. ? More redness, swelling, or pain around your incision. ? More fluid or blood coming from your incision. ? A rash. ? Nausea. ? Little or no interest in activities you used to enjoy. ? Questions about caring for yourself or your baby.  Your incision feels warm to the touch.  Your breasts turn red or become painful or hard.  You feel unusually sad or worried.  You vomit.  You pass a blood clot from your vagina.  You urinate more than usual.  You are dizzy or light-headed. Get help right away if:  You have: ? Pain that does not go away or get better with medicine. ? Chest pain. ? Difficulty breathing. ? Blurred vision or spots in your vision. ? Thoughts about hurting yourself or your baby. ? New pain in your abdomen or in one of your legs. ? A severe  headache.  You faint.  You bleed from your vagina so much that you fill more than one sanitary pad in one hour. Bleeding should not be heavier than your heaviest period. Summary  After the procedure, it is common to have pain at your incision site, abdominal cramping, and slight bleeding from your vagina.  Check your incision area every day for signs of infection.  Tell your health care provider about any unusual symptoms.  Keep all follow-up visits for you and your baby as told by your health care provider. This information is not intended to replace advice given to you by your health care provider. Make sure you discuss any questions you have with your health care provider. Document Revised: 11/13/2017 Document Reviewed: 11/13/2017 Elsevier Patient Education  2020 [employer].

## 2019-06-15 NOTE — H&P (View-Only) (Signed)
    PRE-OPERATIVE HISTORY AND PHYSICAL EXAM  HPI:  Tammy [name] is a 28 y.o. G4P2012 at [redacted]w[redacted]d Estimated Date of Delivery: 06/29/19, and she is planning CS if no labor by 06/23/19.  She is being admitted on 06/23/19 for Elective repeat.    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  Also has anxiety concerns and takes Xanax and Clonopin at times for this.  PMHx: She  has a past medical history of ADHD (attention deficit hyperactivity disorder), Anxiety, Asthma, Depression, Herpes genitalia, History of chlamydia, and Substance abuse (HCC). Also,  has a past surgical history that includes PEG tube placement (Bilateral, 2011); Tonsillectomy (2011); Cesarean section (09/30/2013); and Cesarean section (N/A, 02/16/2016)., family history includes Cancer in her maternal grandmother.,  reports that she has never smoked. She has never used smokeless tobacco. She reports that she does not drink alcohol or use drugs. OB History  Gravida Para Term Preterm AB Living  4 2 2   1 2  SAB TAB Ectopic Multiple Live Births  1     0 2    # Outcome Date GA Lbr Len/2nd Weight Sex Delivery Anes PTL Lv  4 Current           3 Term 02/16/16 [redacted]w[redacted]d  6 lb 12.6 oz (3.08 kg) F CS-LTranv   LIV  2 Term 09/30/13    F CS-Unspec   LIV  1 SAB 2014          Patient denies any other pertinent gynecologic issues. See prenatal record for more complete H&P   Current Outpatient Medications:  .  amphetamine-dextroamphetamine (ADDERALL XR) 20 MG 24 hr capsule, Take 20 mg by mouth daily., Disp: , Rfl:  .  buprenorphine (SUBUTEX) 8 MG SUBL SL tablet, Place 8 mg under the tongue 3 (three) times daily. , Disp: , Rfl:  .  busPIRone (BUSPAR) 15 MG tablet, Take 15 mg by mouth 2 (two) times daily. , Disp: , Rfl:  .  calcium carbonate (TUMS - DOSED IN MG ELEMENTAL CALCIUM) 500 MG chewable tablet, Chew 1-2 tablets by mouth 3 (three) times daily., Disp: , Rfl:  .  clonazePAM (KLONOPIN) 0.5 MG tablet, Take 0.5 mg by mouth 2  (two) times daily. , Disp: , Rfl:  .  escitalopram (LEXAPRO) 20 MG tablet, Take 20 mg by mouth daily. , Disp: , Rfl:  .  esomeprazole (NEXIUM) 20 MG capsule, Take 1 capsule (20 mg total) by mouth daily at 12 noon., Disp: 30 capsule, Rfl: 2 .  Prenat w/o A-FeCbGl-DSS-FA-DHA (CITRANATAL ASSURE) 35-1 & 300 MG tablet, Take 2 tablets by mouth daily., Disp: 30 each, Rfl: 6 .  traZODone (DESYREL) 50 MG tablet, Take 50 mg by mouth at bedtime., Disp: , Rfl:  Also, is allergic to hydrocodone-acetaminophen; morphine and related; naloxone hcl; other; peanuts [peanut oil]; and penicillins.  Review of Systems  Constitutional: Negative for chills, fever and malaise/fatigue.  HENT: Negative for congestion, sinus pain and sore throat.   Eyes: Negative for blurred vision and pain.  Respiratory: Negative for cough and wheezing.   Cardiovascular: Negative for chest pain and leg swelling.  Gastrointestinal: Negative for abdominal pain, constipation, diarrhea, heartburn, nausea and vomiting.  Genitourinary: Negative for dysuria, frequency, hematuria and urgency.  Musculoskeletal: Negative for back pain, joint pain, myalgias and neck pain.  Skin: Negative for itching and rash.  Neurological: Negative for dizziness, tremors and weakness.  Endo/Heme/Allergies: Does not bruise/bleed easily.  Psychiatric/Behavioral: Negative for depression. The patient is   not nervous/anxious and does not have insomnia.     Objective: BP 120/80   Ht 5\' 4"  (1.626 m)   Wt 194 lb (88 kg)   LMP  (LMP Unknown)   BMI 33.30 kg/m  Filed Weights   06/15/19 1459  Weight: 194 lb (88 kg)   Physical Exam Constitutional:      General: She is not in acute distress.    Appearance: She is well-developed.  Genitourinary:     Pelvic exam was performed with patient supine.     Vagina, uterus and rectum normal.     No lesions in the vagina.     No vaginal bleeding.     No cervical motion tenderness, friability, lesion or polyp.     Uterus  is mobile.     Uterus is not enlarged.     No uterine mass detected.    Uterus is midaxial.     No right or left adnexal mass present.     Right adnexa not tender.     Left adnexa not tender.     Genitourinary Comments: Cx closed and thick  HENT:     Head: Normocephalic and atraumatic. No laceration.     Right Ear: Hearing normal.     Left Ear: Hearing normal.     Mouth/Throat:     Pharynx: Uvula midline.  Eyes:     Pupils: Pupils are equal, round, and reactive to light.  Neck:     Thyroid: No thyromegaly.  Cardiovascular:     Rate and Rhythm: Normal rate and regular rhythm.     Heart sounds: No murmur. No friction rub. No gallop.   Pulmonary:     Effort: Pulmonary effort is normal. No respiratory distress.     Breath sounds: Normal breath sounds. No wheezing.  Chest:     Breasts:        Right: No mass, skin change or tenderness.        Left: No mass, skin change or tenderness.  Abdominal:     General: Bowel sounds are normal. There is no distension.     Palpations: Abdomen is soft.     Tenderness: There is no abdominal tenderness. There is no rebound.     Comments: Gravid, FHT 150  Musculoskeletal:        General: Normal range of motion.     Cervical back: Normal range of motion and neck supple.  Neurological:     Mental Status: She is alert and oriented to person, place, and time.     Cranial Nerves: No cranial nerve deficit.  Skin:    General: Skin is warm and dry.  Psychiatric:        Judgment: Judgment normal.  Vitals reviewed.     Assessment: 1. [redacted] weeks gestation of pregnancy   2. S/P cesarean section   3. Supervision of high risk pregnancy, antepartum   4. Pregnancy complicated by subutex maintenance, antepartum (HCC)     PLAN: 1.  Cesarean Delivery as Scheduled.  Patient will undergo surgical management with Cesarean Section.   The risks of surgery were discussed in detail with the patient including but not limited to: bleeding which may require  transfusion or reoperation; infection which may require antibiotics; injury to surrounding organs which may involve bowel, bladder, ureters ; need for additional procedures including laparoscopy or laparotomy; thromboembolic phenomenon, surgical site problems and other postoperative/anesthesia complications. Likelihood of success in alleviating the patient's condition was discussed. Routine postoperative instructions will  be reviewed with the patient and her family in detail after surgery.  The patient concurred with the proposed plan, giving informed written consent for the surgery.  Patient will be NPO procedure.  Preoperative prophylactic antibiotics, as necessary, and SCDs ordered on call to the OR.  Plan to continue Subutex and other psych meds per psychiatry recommendations  Plans to breast feed Plans Nexplanon for PP BC TDaP 04/21/19 Flu shot- declined Plan PAP pp  Barnett Applebaum, M.D. 06/15/2019 3:24 PM

## 2019-06-15 NOTE — Progress Notes (Signed)
PRE-OPERATIVE HISTORY AND PHYSICAL EXAM  HPI:  Tammy [name] is a 29 y.o. [id number] at [redacted]w[redacted]d Estimated Date of Delivery: 06/29/19, and she is planning CS if no labor by 06/23/19.  She is being admitted on 06/23/19 for Elective repeat.    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  Also has anxiety concerns and takes Xanax and Clonopin at times for this.  PMHx: She  has a past medical history of ADHD (attention deficit hyperactivity disorder), Anxiety, Asthma, Depression, Herpes genitalia, History of chlamydia, and Substance abuse (HCC). Also,  has a past surgical history that includes PEG tube placement (Bilateral, 2011); Tonsillectomy (2011); Cesarean section (09/30/2013); and Cesarean section (N/A, 02/16/2016)., family history includes Cancer in her maternal grandmother.,  reports that she has never smoked. She has never used smokeless tobacco. She reports that she does not drink alcohol or use drugs. OB History  Gravida Para Term Preterm AB Living  4 2 2   1 2   SAB TAB Ectopic Multiple Live Births  1     0 2    # Outcome Date GA Lbr Len/2nd Weight Sex Delivery Anes PTL Lv  4 Current           3 Term 02/16/16 [redacted]w[redacted]d  6 lb 12.6 oz (3.08 kg) F CS-LTranv   LIV  2 Term 09/30/13    F CS-Unspec   LIV  1 SAB 2014          Patient denies any other pertinent gynecologic issues. See prenatal record for more complete H&P   Current Outpatient Medications:  .  amphetamine-dextroamphetamine (ADDERALL XR) 20 MG 24 hr capsule, Take 20 mg by mouth daily., Disp: , Rfl:  .  buprenorphine (SUBUTEX) 8 MG SUBL SL tablet, Place 8 mg under the tongue 3 (three) times daily. , Disp: , Rfl:  .  busPIRone (BUSPAR) 15 MG tablet, Take 15 mg by mouth 2 (two) times daily. , Disp: , Rfl:  .  calcium carbonate (TUMS - DOSED IN MG ELEMENTAL CALCIUM) 500 MG chewable tablet, Chew 1-2 tablets by mouth 3 (three) times daily., Disp: , Rfl:  .  clonazePAM (KLONOPIN) 0.5 MG tablet, Take 0.5 mg by mouth 2  (two) times daily. , Disp: , Rfl:  .  escitalopram (LEXAPRO) 20 MG tablet, Take 20 mg by mouth daily. , Disp: , Rfl:  .  esomeprazole (NEXIUM) 20 MG capsule, Take 1 capsule (20 mg total) by mouth daily at 12 noon., Disp: 30 capsule, Rfl: 2 .  Prenat w/o A-FeCbGl-DSS-FA-DHA (CITRANATAL ASSURE) 35-1 & 300 MG tablet, Take 2 tablets by mouth daily., Disp: 30 each, Rfl: 6 .  traZODone (DESYREL) 50 MG tablet, Take 50 mg by mouth at bedtime., Disp: , Rfl:  Also, is allergic to hydrocodone-acetaminophen; morphine and related; naloxone hcl; other; peanuts [peanut oil]; and penicillins.  Review of Systems  Constitutional: Negative for chills, fever and malaise/fatigue.  HENT: Negative for congestion, sinus pain and sore throat.   Eyes: Negative for blurred vision and pain.  Respiratory: Negative for cough and wheezing.   Cardiovascular: Negative for chest pain and leg swelling.  Gastrointestinal: Negative for abdominal pain, constipation, diarrhea, heartburn, nausea and vomiting.  Genitourinary: Negative for dysuria, frequency, hematuria and urgency.  Musculoskeletal: Negative for back pain, joint pain, myalgias and neck pain.  Skin: Negative for itching and rash.  Neurological: Negative for dizziness, tremors and weakness.  Endo/Heme/Allergies: Does not bruise/bleed easily.  Psychiatric/Behavioral: Negative for depression. The patient is  not nervous/anxious and does not have insomnia.     Objective: BP 120/80   Ht 5\' 4"  (1.626 m)   Wt 194 lb (88 kg)   LMP  (LMP Unknown)   BMI 33.30 kg/m  Filed Weights   06/15/19 1459  Weight: 194 lb (88 kg)   Physical Exam Constitutional:      General: She is not in acute distress.    Appearance: She is well-developed.  Genitourinary:     Pelvic exam was performed with patient supine.     Vagina, uterus and rectum normal.     No lesions in the vagina.     No vaginal bleeding.     No cervical motion tenderness, friability, lesion or polyp.     Uterus  is mobile.     Uterus is not enlarged.     No uterine mass detected.    Uterus is midaxial.     No right or left adnexal mass present.     Right adnexa not tender.     Left adnexa not tender.     Genitourinary Comments: Cx closed and thick  HENT:     Head: Normocephalic and atraumatic. No laceration.     Right Ear: Hearing normal.     Left Ear: Hearing normal.     Mouth/Throat:     Pharynx: Uvula midline.  Eyes:     Pupils: Pupils are equal, round, and reactive to light.  Neck:     Thyroid: No thyromegaly.  Cardiovascular:     Rate and Rhythm: Normal rate and regular rhythm.     Heart sounds: No murmur. No friction rub. No gallop.   Pulmonary:     Effort: Pulmonary effort is normal. No respiratory distress.     Breath sounds: Normal breath sounds. No wheezing.  Chest:     Breasts:        Right: No mass, skin change or tenderness.        Left: No mass, skin change or tenderness.  Abdominal:     General: Bowel sounds are normal. There is no distension.     Palpations: Abdomen is soft.     Tenderness: There is no abdominal tenderness. There is no rebound.     Comments: Gravid, FHT 150  Musculoskeletal:        General: Normal range of motion.     Cervical back: Normal range of motion and neck supple.  Neurological:     Mental Status: She is alert and oriented to person, place, and time.     Cranial Nerves: No cranial nerve deficit.  Skin:    General: Skin is warm and dry.  Psychiatric:        Judgment: Judgment normal.  Vitals reviewed.     Assessment: 1. [redacted] weeks gestation of pregnancy   2. S/P cesarean section   3. Supervision of high risk pregnancy, antepartum   4. Pregnancy complicated by subutex maintenance, antepartum (HCC)     PLAN: 1.  Cesarean Delivery as Scheduled.  Patient will undergo surgical management with Cesarean Section.   The risks of surgery were discussed in detail with the patient including but not limited to: bleeding which may require  transfusion or reoperation; infection which may require antibiotics; injury to surrounding organs which may involve bowel, bladder, ureters ; need for additional procedures including laparoscopy or laparotomy; thromboembolic phenomenon, surgical site problems and other postoperative/anesthesia complications. Likelihood of success in alleviating the patient's condition was discussed. Routine postoperative instructions will  be reviewed with the patient and her family in detail after surgery.  The patient concurred with the proposed plan, giving informed written consent for the surgery.  Patient will be NPO procedure.  Preoperative prophylactic antibiotics, as necessary, and SCDs ordered on call to the OR.  Plan to continue Subutex and other psych meds per psychiatry recommendations  Plans to breast feed Plans Nexplanon for PP BC TDaP 04/21/19 Flu shot- declined Plan PAP pp  Barnett Applebaum, M.D. 06/15/2019 3:24 PM

## 2019-06-15 NOTE — Telephone Encounter (Signed)
I agree with Tammy [name] and her recommendations.  She should talk to her prescribing provider, assuming this is not someone from our practice.

## 2019-06-17 ENCOUNTER — Other Ambulatory Visit: Payer: Self-pay

## 2019-06-17 ENCOUNTER — Other Ambulatory Visit
Admission: RE | Admit: 2019-06-17 | Discharge: 2019-06-17 | Disposition: A | Discharge status: Home / Self Care | Payer: Medicaid Other | Source: Ambulatory Visit | Attending: Obstetrics & Gynecology | Admitting: Obstetrics & Gynecology

## 2019-06-17 DIAGNOSIS — Z01818 Encounter for other preprocedural examination: Secondary | ICD-10-CM | POA: Insufficient documentation

## 2019-06-17 NOTE — Patient Instructions (Addendum)
Arrival Time: Please call Labor and Delivery the day before your scheduled C-Section to find out your arrival time. [phone].  Arrival: Arrive to the [facility] If your arrival time is prior to 6:00am, please call Labor and Delivery [phone] from your cell phone upon arrival and someone from L&D or security will come down to the [facility] entrance and escort you to Labor and Delivery. If your arrival time is 6:00am or later, please enter the Medical Mall and follow the greeter's instructions.   Partner: ONE dedicated partner/guest is allowed to accompany you throughout your hospital stay. They are allowed to accompany you to the operating room. Your guest may leave and return during normal visiting hours, but will have to be re-screened with each entry to the [facility]. Partners are not allowed to switch out for a new guest at any point during your hospital stay.\  Your procedure is scheduled on: Tuesday 06/23/19.  Remember: Instructions that are not followed completely may result in serious medical risk, up to and including death, or upon the discretion of your surgeon and anesthesiologist your surgery may need to be rescheduled.      _X__ 1. Do not eat food after midnight the night before your procedure.                 No gum chewing or hard candies. You may drink clear liquids up to 2 hours                 before you are scheduled to arrive for your surgery- DO NOT drink clear                 liquids within 2 hours of the start of your surgery.                 Clear Liquids include:  water, apple juice without pulp, clear carbohydrate                 drink such as Clearfast or Gatorade, Black Coffee or Tea (Do not add                 anything to coffee or tea).  ** Dr. Tiburcio Pea would like for you to drink the Pre-Surgery Ensure on the morning of your surgery 2 hours prior to your scheduled arrival time.**   __X__2.  On the morning of surgery brush your teeth with toothpaste  and water, you may rinse your mouth with mouthwash if you wish.  Do not swallow any toothpaste or mouthwash.      _X__ 3.  No Alcohol for 24 hours before or after surgery.    _X__ 4.  Do Not Smoke or use e-cigarettes For 24 Hours Prior to Your Surgery.                 Do not use any chewable tobacco products for at least 6 hours prior to                 Surgery.   __X__5.  Notify your doctor if there is any change in your medical condition      (cold, fever, infections).       Do not wear jewelry, make-up, hairpins, clips or nail polish. Do not wear lotions, powders, or perfumes.  Do not shave 48 hours prior to surgery. Men may shave face and neck. Do not bring valuables to the hospital.      [facility] is not responsible  for any belongings or valuables.    Contacts, dentures/partials or body piercings may not be worn into surgery. Bring a case for your contacts, glasses or hearing aids, a denture cup will be supplied.     __X__ Take these medicines the morning of surgery with A SIP OF WATER:     1. buprenorphine (SUBUTEX) 8 MG SUBL SL tablet  2. busPIRone (BUSPAR) 15 MG tablet  3. clonazePAM (KLONOPIN) 0.5 MG tablet  4. escitalopram (LEXAPRO) 20 MG tablet       __X__ Use SAGE wipes as directed      __X__ Stop Anti-inflammatories 7 days before surgery such as Advil, Ibuprofen, Motrin, BC or Goodies Powder, Naprosyn, Naproxen, Aleve, Aspirin, Meloxicam. May take Tylenol if needed for pain or discomfort.     __X__ Don't start taking any new herbal supplements before your procedure.   ____ Bring C-Pap to the hospital.

## 2019-06-19 ENCOUNTER — Other Ambulatory Visit: Payer: Self-pay

## 2019-06-19 ENCOUNTER — Encounter: Payer: Medicaid Other | Admitting: Obstetrics and Gynecology

## 2019-06-19 ENCOUNTER — Other Ambulatory Visit
Admission: RE | Admit: 2019-06-19 | Discharge: 2019-06-19 | Disposition: A | Discharge status: Home / Self Care | Payer: Medicaid Other | Source: Ambulatory Visit | Attending: Obstetrics & Gynecology | Admitting: Obstetrics & Gynecology

## 2019-06-19 DIAGNOSIS — Z20822 Contact with and (suspected) exposure to covid-19: Secondary | ICD-10-CM | POA: Diagnosis not present

## 2019-06-19 DIAGNOSIS — Z01812 Encounter for preprocedural laboratory examination: Secondary | ICD-10-CM | POA: Diagnosis not present

## 2019-06-19 LAB — SARS CORONAVIRUS 2 (TAT 6-24 HRS): SARS Coronavirus 2: NEGATIVE

## 2019-06-19 LAB — TYPE AND SCREEN
ABO/RH(D): A POS
Antibody Screen: NEGATIVE
Extend sample reason: UNDETERMINED

## 2019-06-19 LAB — CBC
HCT: 39.3 % (ref 36.0–46.0)
Hemoglobin: 13.3 g/dL (ref 12.0–15.0)
MCH: 31.2 pg (ref 26.0–34.0)
MCHC: 33.8 g/dL (ref 30.0–36.0)
MCV: 92.3 fL (ref 80.0–100.0)
Platelets: 189 10*3/uL (ref 150–400)
RBC: 4.26 MIL/uL (ref 3.87–5.11)
RDW: 13.7 % (ref 11.5–15.5)
WBC: 9.3 10*3/uL (ref 4.0–10.5)
nRBC: 0 % (ref 0.0–0.2)

## 2019-06-22 MED ORDER — CLINDAMYCIN PHOSPHATE 900 MG/50ML IV SOLN
900.0000 mg | INTRAVENOUS | Status: AC
  Administered 2019-06-23: 900 mg via INTRAVENOUS
  Filled 2019-06-22: qty 50

## 2019-06-22 MED ORDER — GENTAMICIN SULFATE 40 MG/ML IJ SOLN
5.0000 mg/kg | INTRAVENOUS | Status: AC
  Administered 2019-06-23: 440 mg via INTRAVENOUS
  Filled 2019-06-22: qty 11

## 2019-06-23 ENCOUNTER — Inpatient Hospital Stay
Admission: RE | Admit: 2019-06-23 | Discharge: 2019-06-28 | DRG: 787 | Disposition: A | Discharge status: Home / Self Care | Payer: Medicaid Other | Attending: Obstetrics & Gynecology | Admitting: Obstetrics & Gynecology

## 2019-06-23 ENCOUNTER — Other Ambulatory Visit: Payer: Self-pay

## 2019-06-23 ENCOUNTER — Encounter: Payer: Self-pay | Admitting: Obstetrics & Gynecology

## 2019-06-23 ENCOUNTER — Encounter
Admission: RE | Disposition: A | Discharge status: Home / Self Care | Payer: Self-pay | Source: Home / Self Care | Attending: Obstetrics & Gynecology

## 2019-06-23 ENCOUNTER — Inpatient Hospital Stay: Payer: Medicaid Other | Admitting: Certified Registered"

## 2019-06-23 DIAGNOSIS — F1120 Opioid dependence, uncomplicated: Secondary | ICD-10-CM

## 2019-06-23 DIAGNOSIS — F419 Anxiety disorder, unspecified: Secondary | ICD-10-CM | POA: Diagnosis present

## 2019-06-23 DIAGNOSIS — O99344 Other mental disorders complicating childbirth: Secondary | ICD-10-CM | POA: Diagnosis present

## 2019-06-23 DIAGNOSIS — Z98891 History of uterine scar from previous surgery: Secondary | ICD-10-CM

## 2019-06-23 DIAGNOSIS — D62 Acute posthemorrhagic anemia: Secondary | ICD-10-CM | POA: Diagnosis not present

## 2019-06-23 DIAGNOSIS — Z79899 Other long term (current) drug therapy: Secondary | ICD-10-CM

## 2019-06-23 DIAGNOSIS — O9081 Anemia of the puerperium: Secondary | ICD-10-CM | POA: Diagnosis not present

## 2019-06-23 DIAGNOSIS — Z3A38 38 weeks gestation of pregnancy: Secondary | ICD-10-CM

## 2019-06-23 DIAGNOSIS — O34211 Maternal care for low transverse scar from previous cesarean delivery: Secondary | ICD-10-CM | POA: Diagnosis present

## 2019-06-23 DIAGNOSIS — O99320 Drug use complicating pregnancy, unspecified trimester: Secondary | ICD-10-CM

## 2019-06-23 DIAGNOSIS — O99324 Drug use complicating childbirth: Secondary | ICD-10-CM

## 2019-06-23 LAB — CBC
HCT: 37.5 % (ref 36.0–46.0)
HCT: 36 % (ref 36.0–46.0)
Hemoglobin: 13 g/dL (ref 12.0–15.0)
Hemoglobin: 12.3 g/dL (ref 12.0–15.0)
MCH: 31.1 pg (ref 26.0–34.0)
MCH: 30.7 pg (ref 26.0–34.0)
MCHC: 34.7 g/dL (ref 30.0–36.0)
MCHC: 34.2 g/dL (ref 30.0–36.0)
MCV: 89.7 fL (ref 80.0–100.0)
MCV: 89.8 fL (ref 80.0–100.0)
Platelets: 202 10*3/uL (ref 150–400)
Platelets: 154 10*3/uL (ref 150–400)
RBC: 4.18 MIL/uL (ref 3.87–5.11)
RBC: 4.01 MIL/uL (ref 3.87–5.11)
RDW: 13.5 % (ref 11.5–15.5)
RDW: 13.6 % (ref 11.5–15.5)
WBC: 9.9 10*3/uL (ref 4.0–10.5)
WBC: 13.8 10*3/uL — ABNORMAL HIGH (ref 4.0–10.5)
nRBC: 0 % (ref 0.0–0.2)
nRBC: 0 % (ref 0.0–0.2)

## 2019-06-23 LAB — TYPE AND SCREEN
ABO/RH(D): A POS
Antibody Screen: NEGATIVE

## 2019-06-23 LAB — CREATININE, SERUM
Creatinine, Ser: 0.58 mg/dL (ref 0.44–1.00)
GFR calc Af Amer: >60 mL/min (ref >60)
GFR calc non Af Amer: >60 mL/min (ref >60)

## 2019-06-23 SURGERY — Surgical Case
Anesthesia: Spinal

## 2019-06-23 MED ORDER — KETOROLAC TROMETHAMINE 30 MG/ML IJ SOLN
30.0000 mg | Freq: Four times a day (QID) | INTRAMUSCULAR | Status: AC
  Administered 2019-06-23 – 2019-06-24 (×4): 30 mg via INTRAVENOUS
  Filled 2019-06-23 (×4): qty 1

## 2019-06-23 MED ORDER — BUSPIRONE HCL 15 MG PO TABS
15.0000 mg | ORAL_TABLET | Freq: Two times a day (BID) | ORAL | Status: DC
  Administered 2019-06-24 – 2019-06-28 (×8): 15 mg via ORAL
  Filled 2019-06-23 (×9): qty 1

## 2019-06-23 MED ORDER — NALOXONE HCL 4 MG/10ML IJ SOLN
1.0000 ug/kg/h | INTRAVENOUS | Status: DC | PRN
  Filled 2019-06-23: qty 5

## 2019-06-23 MED ORDER — AMPHETAMINE-DEXTROAMPHET ER 5 MG PO CP24
20.0000 mg | ORAL_CAPSULE | Freq: Every day | ORAL | Status: DC
  Administered 2019-06-24 – 2019-06-28 (×5): 20 mg via ORAL
  Filled 2019-06-23 (×7): qty 4

## 2019-06-23 MED ORDER — KETOROLAC TROMETHAMINE 30 MG/ML IJ SOLN: 30.0000 mg | Freq: Four times a day (QID) | INTRAMUSCULAR | Status: DC

## 2019-06-23 MED ORDER — SODIUM CHLORIDE 0.9 % IV SOLN
INTRAVENOUS | Status: DC | PRN
  Administered 2019-06-23: 50 ug/min via INTRAVENOUS

## 2019-06-23 MED ORDER — DIPHENHYDRAMINE HCL 25 MG PO CAPS: 25.0000 mg | ORAL_CAPSULE | ORAL | Status: DC | PRN

## 2019-06-23 MED ORDER — FENTANYL CITRATE (PF) 100 MCG/2ML IJ SOLN: 12.5000 ug | INTRAMUSCULAR | Status: DC | PRN

## 2019-06-23 MED ORDER — CLONAZEPAM 0.5 MG PO TBDP
0.5000 mg | ORAL_TABLET | Freq: Two times a day (BID) | ORAL | Status: DC
  Administered 2019-06-23 – 2019-06-28 (×10): 0.5 mg via ORAL
  Filled 2019-06-23 (×18): qty 1

## 2019-06-23 MED ORDER — ONDANSETRON HCL 4 MG/2ML IJ SOLN
INTRAMUSCULAR | Status: DC | PRN
  Administered 2019-06-23: 4 mg via INTRAVENOUS

## 2019-06-23 MED ORDER — SODIUM CHLORIDE 0.9% FLUSH: 3.0000 mL | INTRAVENOUS | Status: DC | PRN

## 2019-06-23 MED ORDER — BUPIVACAINE HCL (PF) 0.5 % IJ SOLN
INTRAMUSCULAR | Status: DC | PRN
  Administered 2019-06-23: 10 mL

## 2019-06-23 MED ORDER — OXYCODONE HCL 5 MG PO TABS
10.0000 mg | ORAL_TABLET | ORAL | Status: DC | PRN
  Administered 2019-06-23 – 2019-06-24 (×4): 10 mg via ORAL
  Filled 2019-06-23 (×4): qty 2

## 2019-06-23 MED ORDER — NALBUPHINE HCL 10 MG/ML IJ SOLN: 5.0000 mg | Freq: Once | INTRAMUSCULAR | Status: DC | PRN

## 2019-06-23 MED ORDER — BUPIVACAINE ON-Q PAIN PUMP (FOR ORDER SET NO CHG)
INJECTION | Status: AC
  Filled 2019-06-23: qty 1

## 2019-06-23 MED ORDER — PROPOFOL 10 MG/ML IV BOLUS
INTRAVENOUS | Status: AC
  Filled 2019-06-23: qty 20

## 2019-06-23 MED ORDER — NALBUPHINE HCL 10 MG/ML IJ SOLN: 5.0000 mg | INTRAMUSCULAR | Status: DC | PRN

## 2019-06-23 MED ORDER — DIPHENHYDRAMINE HCL 25 MG PO CAPS: 25.0000 mg | ORAL_CAPSULE | Freq: Four times a day (QID) | ORAL | Status: DC | PRN

## 2019-06-23 MED ORDER — SENNOSIDES-DOCUSATE SODIUM 8.6-50 MG PO TABS
2.0000 | ORAL_TABLET | ORAL | Status: DC
  Administered 2019-06-24 – 2019-06-28 (×5): 2 via ORAL
  Filled 2019-06-23 (×5): qty 2

## 2019-06-23 MED ORDER — OXYTOCIN 40 UNITS IN NORMAL SALINE INFUSION - SIMPLE MED
2.5000 [IU]/h | INTRAVENOUS | Status: AC
  Administered 2019-06-23: 2.5 [IU]/h via INTRAVENOUS
  Filled 2019-06-23: qty 1000

## 2019-06-23 MED ORDER — BUPRENORPHINE HCL-NALOXONE HCL 8-2 MG SL SUBL: 1.0000 | SUBLINGUAL_TABLET | Freq: Every day | SUBLINGUAL | Status: DC

## 2019-06-23 MED ORDER — ESCITALOPRAM OXALATE 10 MG PO TABS
20.0000 mg | ORAL_TABLET | Freq: Every day | ORAL | Status: DC
  Administered 2019-06-24 – 2019-06-28 (×5): 20 mg via ORAL
  Filled 2019-06-23 (×5): qty 2

## 2019-06-23 MED ORDER — TRAZODONE HCL 50 MG PO TABS
50.0000 mg | ORAL_TABLET | Freq: Every day | ORAL | Status: DC
  Administered 2019-06-24 – 2019-06-27 (×4): 50 mg via ORAL
  Filled 2019-06-23 (×6): qty 1

## 2019-06-23 MED ORDER — DIPHENHYDRAMINE HCL 50 MG/ML IJ SOLN: 12.5000 mg | INTRAMUSCULAR | Status: DC | PRN

## 2019-06-23 MED ORDER — IBUPROFEN 800 MG PO TABS
800.0000 mg | ORAL_TABLET | Freq: Four times a day (QID) | ORAL | Status: DC
  Administered 2019-06-24 – 2019-06-28 (×16): 800 mg via ORAL
  Filled 2019-06-23 (×16): qty 1

## 2019-06-23 MED ORDER — ONDANSETRON HCL 4 MG/2ML IJ SOLN: 4.0000 mg | Freq: Three times a day (TID) | INTRAMUSCULAR | Status: DC | PRN

## 2019-06-23 MED ORDER — OXYTOCIN 40 UNITS IN NORMAL SALINE INFUSION - SIMPLE MED
INTRAVENOUS | Status: AC
  Filled 2019-06-23: qty 1000

## 2019-06-23 MED ORDER — NALOXONE HCL 0.4 MG/ML IJ SOLN: 0.4000 mg | INTRAMUSCULAR | Status: DC | PRN

## 2019-06-23 MED ORDER — LACTATED RINGERS IV SOLN: INTRAVENOUS | Status: DC

## 2019-06-23 MED ORDER — MENTHOL 3 MG MT LOZG
1.0000 | LOZENGE | OROMUCOSAL | Status: DC | PRN
  Filled 2019-06-23: qty 9

## 2019-06-23 MED ORDER — OXYCODONE HCL 5 MG PO TABS
5.0000 mg | ORAL_TABLET | ORAL | Status: DC | PRN
  Administered 2019-06-23: 5 mg via ORAL
  Filled 2019-06-23: qty 1

## 2019-06-23 MED ORDER — PRENATAL MULTIVITAMIN CH
1.0000 | ORAL_TABLET | Freq: Every day | ORAL | Status: DC
  Administered 2019-06-23 – 2019-06-28 (×6): 1 via ORAL
  Filled 2019-06-23 (×6): qty 1

## 2019-06-23 MED ORDER — SIMETHICONE 80 MG PO CHEW: 80.0000 mg | CHEWABLE_TABLET | ORAL | Status: DC | PRN

## 2019-06-23 MED ORDER — ACETAMINOPHEN 325 MG PO TABS
650.0000 mg | ORAL_TABLET | Freq: Four times a day (QID) | ORAL | Status: AC
  Administered 2019-06-23 – 2019-06-24 (×4): 650 mg via ORAL
  Filled 2019-06-23 (×4): qty 2

## 2019-06-23 MED ORDER — WITCH HAZEL-GLYCERIN EX PADS: 1.0000 "application " | MEDICATED_PAD | CUTANEOUS | Status: DC | PRN

## 2019-06-23 MED ORDER — LACTATED RINGERS IV BOLUS
500.0000 mL | Freq: Once | INTRAVENOUS | Status: AC
  Administered 2019-06-23: 500 mL via INTRAVENOUS

## 2019-06-23 MED ORDER — COCONUT OIL OIL
1.0000 "application " | TOPICAL_OIL | Status: DC | PRN
  Administered 2019-06-23 – 2019-06-25 (×2): 1 via TOPICAL
  Filled 2019-06-23 (×2): qty 120

## 2019-06-23 MED ORDER — ACETAMINOPHEN 325 MG PO TABS: 650.0000 mg | ORAL_TABLET | ORAL | Status: DC | PRN

## 2019-06-23 MED ORDER — FENTANYL CITRATE (PF) 100 MCG/2ML IJ SOLN
INTRAMUSCULAR | Status: DC | PRN
  Administered 2019-06-23: 25 ug via INTRAVENOUS
  Administered 2019-06-23: 35 ug via INTRAVENOUS
  Administered 2019-06-23: 15 ug via INTRAVENOUS

## 2019-06-23 MED ORDER — KETOROLAC TROMETHAMINE 30 MG/ML IJ SOLN
INTRAMUSCULAR | Status: DC | PRN
  Administered 2019-06-23: 30 mg via INTRAVENOUS

## 2019-06-23 MED ORDER — FENTANYL CITRATE (PF) 100 MCG/2ML IJ SOLN
INTRAMUSCULAR | Status: AC
  Filled 2019-06-23: qty 2

## 2019-06-23 MED ORDER — BUPIVACAINE 0.25 % ON-Q PUMP DUAL CATH 400 ML
400.0000 mL | INJECTION | Status: DC
  Filled 2019-06-23: qty 400

## 2019-06-23 MED ORDER — OXYTOCIN 40 UNITS IN NORMAL SALINE INFUSION - SIMPLE MED
INTRAVENOUS | Status: DC | PRN
  Administered 2019-06-23: 600 mL/h via INTRAVENOUS

## 2019-06-23 MED ORDER — TETANUS-DIPHTH-ACELL PERTUSSIS 5-2.5-18.5 LF-MCG/0.5 IM SUSP: 0.5000 mL | Freq: Once | INTRAMUSCULAR | Status: DC

## 2019-06-23 MED ORDER — BUPIVACAINE HCL (PF) 0.5 % IJ SOLN
INTRAMUSCULAR | Status: AC
  Filled 2019-06-23: qty 30

## 2019-06-23 MED ORDER — PHENYLEPHRINE HCL (PRESSORS) 10 MG/ML IV SOLN
INTRAVENOUS | Status: DC | PRN
  Administered 2019-06-23: 200 ug via INTRAVENOUS

## 2019-06-23 MED ORDER — MORPHINE SULFATE (PF) 0.5 MG/ML IJ SOLN
INTRAMUSCULAR | Status: AC
  Filled 2019-06-23: qty 10

## 2019-06-23 MED ORDER — BUPRENORPHINE HCL 2 MG SL SUBL
8.0000 mg | SUBLINGUAL_TABLET | Freq: Three times a day (TID) | SUBLINGUAL | Status: DC
  Administered 2019-06-23 – 2019-06-28 (×16): 8 mg via SUBLINGUAL
  Filled 2019-06-23 (×16): qty 4

## 2019-06-23 MED ORDER — SIMETHICONE 80 MG PO CHEW
80.0000 mg | CHEWABLE_TABLET | Freq: Three times a day (TID) | ORAL | Status: DC
  Administered 2019-06-23 – 2019-06-28 (×13): 80 mg via ORAL
  Filled 2019-06-23 (×14): qty 1

## 2019-06-23 MED ORDER — MEPERIDINE HCL 25 MG/ML IJ SOLN: 6.2500 mg | INTRAMUSCULAR | Status: DC | PRN

## 2019-06-23 MED ORDER — PHENYLEPHRINE HCL (PRESSORS) 10 MG/ML IV SOLN
INTRAVENOUS | Status: AC
  Filled 2019-06-23: qty 1

## 2019-06-23 MED ORDER — SIMETHICONE 80 MG PO CHEW: 80.0000 mg | CHEWABLE_TABLET | ORAL | Status: DC

## 2019-06-23 MED ORDER — DIBUCAINE (PERIANAL) 1 % EX OINT: 1.0000 "application " | TOPICAL_OINTMENT | CUTANEOUS | Status: DC | PRN

## 2019-06-23 MED ORDER — BUPIVACAINE IN DEXTROSE 0.75-8.25 % IT SOLN
INTRATHECAL | Status: DC | PRN
  Administered 2019-06-23: 1.6 mL via INTRATHECAL

## 2019-06-23 MED ORDER — SOD CITRATE-CITRIC ACID 500-334 MG/5ML PO SOLN
30.0000 mL | ORAL | Status: AC
  Administered 2019-06-23: 30 mL via ORAL
  Filled 2019-06-23: qty 30

## 2019-06-23 SURGICAL SUPPLY — 25 items
CANISTER SUCT 3000ML PPV (MISCELLANEOUS) ×2 IMPLANT
CATH KIT ON-Q SILVERSOAK 5IN (CATHETERS) ×4 IMPLANT
CHLORAPREP W/TINT 26 (MISCELLANEOUS) ×4 IMPLANT
COVER WAND RF STERILE (DRAPES) ×2 IMPLANT
DERMABOND ADVANCED (GAUZE/BANDAGES/DRESSINGS) ×1
DERMABOND ADVANCED .7 DNX12 (GAUZE/BANDAGES/DRESSINGS) ×1 IMPLANT
DRSG OPSITE POSTOP 4X10 (GAUZE/BANDAGES/DRESSINGS) ×2 IMPLANT
ELECT CAUTERY BLADE 6.4 (BLADE) ×2 IMPLANT
ELECT REM PT RETURN 9FT ADLT (ELECTROSURGICAL) ×2
ELECTRODE REM PT RTRN 9FT ADLT (ELECTROSURGICAL) ×1 IMPLANT
GLOVE SKINSENSE NS SZ8.0 LF (GLOVE) ×1
GLOVE SKINSENSE STRL SZ8.0 LF (GLOVE) ×1 IMPLANT
GOWN STRL REUS W/ TWL LRG LVL3 (GOWN DISPOSABLE) ×1 IMPLANT
GOWN STRL REUS W/ TWL XL LVL3 (GOWN DISPOSABLE) ×2 IMPLANT
GOWN STRL REUS W/TWL LRG LVL3 (GOWN DISPOSABLE) ×1
GOWN STRL REUS W/TWL XL LVL3 (GOWN DISPOSABLE) ×2
NS IRRIG 1000ML POUR BTL (IV SOLUTION) ×2 IMPLANT
PACK C SECTION AR (MISCELLANEOUS) ×2 IMPLANT
PAD OB MATERNITY 4.3X12.25 (PERSONAL CARE ITEMS) ×2 IMPLANT
PAD PREP 24X41 OB/GYN DISP (PERSONAL CARE ITEMS) ×2 IMPLANT
PENCIL SMOKE ULTRAEVAC 22 CON (MISCELLANEOUS) ×2 IMPLANT
SUT MAXON ABS #0 GS21 30IN (SUTURE) ×4 IMPLANT
SUT VIC AB 1 CT1 36 (SUTURE) ×6 IMPLANT
SUT VIC AB 2-0 CT1 36 (SUTURE) ×2 IMPLANT
SUT VIC AB 4-0 FS2 27 (SUTURE) ×2 IMPLANT

## 2019-06-23 NOTE — Op Note (Signed)
Cesarean Section Procedure Note Indications: prior cesarean section and term intrauterine pregnancy  Pre-operative Diagnosis: Intrauterine pregnancy [redacted]w[redacted]d ;  prior cesarean section and term intrauterine pregnancy Post-operative Diagnosis: same, delivered. Procedure: Low Transverse Cesarean Section Surgeon: Annamarie Major, MD, FACOG Assistant(s): Dr Jean Rosenthal, No other capable assistant available, in surgery requiring high level assistant. Anesthesia: Spinal anesthesia Estimated Blood Loss:500 Complications: None; patient tolerated the procedure well. Disposition: PACU - hemodynamically stable. Condition: stable  Findings: A female infant in the cephalic presentation. "Riley" Amniotic fluid - Clear  Birth weight 6-12 lbs.  Apgars of 8 and 9.  Intact placenta with a three-vessel cord. Grossly normal uterus, tubes and ovaries bilaterally. Some intraabdominal adhesions were noted.  Procedure Details   The patient was taken to Operating Room, identified as the correct patient and the procedure verified as C-Section Delivery. A Time Out was held and the above information confirmed. After induction of anesthesia, the patient was draped and prepped in the usual sterile manner. A Pfannenstiel incision was made and carried down through the subcutaneous tissue to the fascia. Fascial incision was made and extended transversely with the Mayo scissors. The fascia was separated from the underlying rectus tissue superiorly and inferiorly. The peritoneum was identified and entered bluntly. Peritoneal incision was extended longitudinally. The utero-vesical peritoneal reflection was incised transversely and a bladder flap was created digitally.  A low transverse hysterotomy was made. The fetus was delivered atraumatically. The umbilical cord was clamped x2 and cut and the infant was handed to the awaiting pediatricians. The placenta was removed intact and appeared normal with a 3-vessel cord.  The uterus was  exteriorized and cleared of all clot and debris. The hysterotomy was closed with running sutures of 0 Vicryl suture. A second imbricating layer was placed with the same suture. Excellent hemostasis was observed. The uterus was returned to the abdomen. The pelvis was irrigated and again, excellent hemostasis was noted.  The On Q Pain pump System was then placed.  Trocars were placed through the abdominal wall into the subfascial space and these were used to thread the silver soaker cathaters into place.The rectus fascia was then reapproximated with running sutures of Maxon, with careful placement not to incorporate the cathaters. Subcutaneous tissues are then irrigated with saline and hemostasis assured.  Skin is then closed with 4-0 vicryl suture in a subcuticular fashion followed by skin adhesive. The cathaters are flushed each with 5 mL of Bupivicaine and stabilized into place with dressing. Instrument, sponge, and needle counts were correct prior to the abdominal closure and at the conclusion of the case.  The surgical assistant performed tissue retraction, assistance with suturing, and fundal pressure.   The patient tolerated the procedure well and was transferred to the recovery room in stable condition.   Annamarie Major, MD, [facility], [facility] 06/23/2019  9:03 AM

## 2019-06-23 NOTE — Transfer of Care (Signed)
Immediate Anesthesia Transfer of Care Note  Patient: Tammy [name]  Procedure(s) Performed: CESAREAN SECTION (N/A )  Patient Location: PACU  Anesthesia Type:Spinal  Level of Consciousness: awake, alert  and oriented  Airway & Oxygen Therapy: Patient Spontanous Breathing  Post-op Assessment: Report given to RN and Post -op Vital signs reviewed and stable  Post vital signs: Reviewed and stable  Last Vitals:  Vitals Value Taken Time  BP 106/61 06/23/19 0909  Temp 36.6 C 06/23/19 0909  Pulse 78 06/23/19 0909  Resp 20 06/23/19 0909  SpO2 100     Last Pain:  Vitals:   06/23/19 0909  TempSrc: Oral  PainSc: 0-No pain         Complications: No apparent anesthesia complications

## 2019-06-23 NOTE — Lactation Note (Signed)
This note was copied from a baby's chart. Lactation Consultation Note  Patient Name: Tammy [name] MHWKG'S Date: 06/23/2019 Reason for consult: Initial assessment  LC attempted to assist mom with breastfeeding on MBU. Dad was asking for a bottle with formula, mom continued to say she wanted to try breastfeeding. Mom was able to easily hand express colostrum for baby, to help encourage a latch. Mom did take baby from [facility] to place at the breast in cross cradle position, but placed in good position, but did not latch/suck or swallow. Mom pushed baby back to [facility], LC noticing some mucous in mouth and used blue bulb to syringe out clear mucous. With RN gone, Mom reiterated her to desire to attempt to breastfeed a few more times before offering the bottle. Mom reports feedings of over 1 hour with the first feeding shortly before 10am, and second feeding of 30 minutes at 1pm. LC provided education on newborn stomach size and feeding patterns the first day of life, wet/stool diaper expectations, benefits of skin to skin comfort and soothing, and encouraged to hold baby for now to see if she began to show hunger signs again. Mom nodded in acknowledgement, and put baby to chest with baby blanket, baby settled quickly.  Maternal Data Formula Feeding for Exclusion: No Has patient been taught Hand Expression?: Yes Does the patient have breastfeeding experience prior to this delivery?: No  Feeding Feeding Type: Breast Fed  LATCH Score Latch: Too sleepy or reluctant, no latch achieved, no sucking elicited.(baby not interested)  Audible Swallowing: None(no latch achieved)  Type of Nipple: Everted at rest and after stimulation  Comfort (Breast/Nipple): Soft / non-tender  Hold (Positioning): No assistance needed to correctly position infant at breast.  LATCH Score: 6  Interventions Interventions: Breast feeding basics reviewed;Assisted with latch;Support pillows  Lactation Tools  Discussed/Used WIC Program: Yes   Consult Status Consult Status: Follow-up Date: 06/23/19 Follow-up type: In-patient    Danford Bad 06/23/2019, 3:17 PM

## 2019-06-23 NOTE — Anesthesia Preprocedure Evaluation (Signed)
Anesthesia Evaluation  Patient identified by MRN, date of birth, ID band Patient awake    Reviewed: Allergy & Precautions, NPO status , Patient's Chart, lab work & pertinent test results  History of Anesthesia Complications Negative for: history of anesthetic complications  Airway Mallampati: II  TM Distance: >3 FB Neck ROM: Full    Dental  (+) Poor Dentition   Pulmonary asthma ,    breath sounds clear to auscultation- rhonchi (-) wheezing      Cardiovascular Exercise Tolerance: Good (-) hypertension(-) CAD, (-) Past MI, (-) Cardiac Stents and (-) CABG  Rhythm:Regular Rate:Normal - Systolic murmurs and - Diastolic murmurs    Neuro/Psych neg Seizures PSYCHIATRIC DISORDERS Anxiety Depression negative neurological ROS     GI/Hepatic negative GI ROS, Neg liver ROS,   Endo/Other  negative endocrine ROSneg diabetes  Renal/GU negative Renal ROS     Musculoskeletal negative musculoskeletal ROS (+)   Abdominal (+) + obese,   Peds  Hematology negative hematology ROS (+)   Anesthesia Other Findings Past Medical History: No date: ADHD (attention deficit hyperactivity disorder) No date: Anxiety No date: Asthma No date: Depression No date: Herpes genitalia No date: History of chlamydia No date: Substance abuse (HCC)   Reproductive/Obstetrics (+) Pregnancy                             Lab Results  Component Value Date   WBC 9.9 06/23/2019   HGB 13.0 06/23/2019   HCT 37.5 06/23/2019   MCV 89.7 06/23/2019   PLT 202 06/23/2019    Anesthesia Physical Anesthesia Plan  ASA: II  Anesthesia Plan: Spinal   Post-op Pain Management:    Induction:   PONV Risk Score and Plan: 2 and Ondansetron  Airway Management Planned: Natural Airway  Additional Equipment:   Intra-op Plan:   Post-operative Plan:   Informed Consent: I have reviewed the patients History and Physical, chart, labs and  discussed the procedure including the risks, benefits and alternatives for the proposed anesthesia with the patient or authorized representative who has indicated his/her understanding and acceptance.     Dental advisory given  Plan Discussed with: CRNA and Anesthesiologist  Anesthesia Plan Comments:         Anesthesia Quick Evaluation

## 2019-06-23 NOTE — Interval H&P Note (Signed)
History and Physical Interval Note:  06/23/2019 7:10 AM  Tammy [name]  has presented today for surgery, with the diagnosis of Repeat Cesarean Section.  The various methods of treatment have been discussed with the patient and family. After consideration of risks, benefits and other options for treatment, the patient has consented to  Procedure(s): CESAREAN SECTION (N/A) as a surgical intervention.  The patient's history has been reviewed, patient examined, no change in status, stable for surgery.  I have reviewed the patient's chart and labs.  Questions were answered to the patient's satisfaction.     Letitia Libra

## 2019-06-23 NOTE — Progress Notes (Signed)
Spoke to [employer], MD about order for suboxone and pt refusing medication due to allergy. MD states to order subutex 8 mg sublingual 3 times daily and RN spoke to pharmacy about order given by provider. RN also spoke to Felicity, MD about low urine output in recovery and MD states to start lactated ringers bolus of 500 mL.

## 2019-06-23 NOTE — Anesthesia Procedure Notes (Signed)
Spinal  Patient location during procedure: OR Start time: 06/23/2019 7:51 AM End time: 06/23/2019 7:56 AM Staffing Performed: resident/CRNA  Anesthesiologist: Emmie Niemann, MD Resident/CRNA: Caryl Asp, CRNA Preanesthetic Checklist Completed: patient identified, IV checked, site marked, risks and benefits discussed, surgical consent, monitors and equipment checked, pre-op evaluation and timeout performed Spinal Block Patient position: sitting Prep: ChloraPrep Patient monitoring: heart rate, continuous pulse ox and blood pressure Approach: midline Location: L4-5 Injection technique: single-shot Needle Needle type: Introducer and Pencil-Tip  Needle gauge: 24 G Needle length: 9 cm Additional Notes Negative paresthesia. Negative blood return. Positive free-flowing CSF. Expiration date of kit checked and confirmed. Patient tolerated procedure well, without complications.

## 2019-06-23 NOTE — Plan of Care (Signed)
Pt had cesarean section and delivered baby girl at [phone]. Pt recovering well in PACU and plan to transfer to mother baby for couplet care.

## 2019-06-23 NOTE — Discharge Summary (Signed)
OB Discharge Summary     Patient Name: Tammy [name] DOB: 1991-02-22 MRN: [id number]  Date of admission: 06/23/2019 Delivering MD: Hoyt Koch, MD  Date of Delivery: 06/23/2019  Date of discharge: 06/28/2019  Admitting diagnosis: History of cesarean delivery [Z98.891] Intrauterine pregnancy: [redacted]w[redacted]d     Secondary diagnosis: Sunstance abuse in pregnancy, Prior CS     Discharge diagnosis: Term Pregnancy Delivered, Reasons for cesarean section  Elective repeat and Substance abuse in pregnancy                         Hospital course:  Sceduled C/S   29 y.o. yo [id number] at [redacted]w[redacted]d was admitted to the hospital 06/23/2019 for scheduled cesarean section with the following indication:Elective Repeat.  Membrane Rupture Time/Date: 8:12 AM ,06/23/2019   Patient delivered a Viable infant.06/23/2019  Details of operation can be found in separate operative note.  Pateint had an uncomplicated postpartum course.  She is ambulating, tolerating a regular diet, passing flatus, and urinating well. Social Services was consulted regarding patient falling asleep holding baby and other safety issues concerning feedings and patient's ability to safely care for infant. She was seen by social services and a DSS file was opened but she was allowed to go home with baby. Patient is discharged home in stable condition on  06/28/19                                                                        Post partum procedures:none, refused Varivax  Complications: None  Physical exam on 06/28/2019: Vitals:   06/27/19 0800 06/27/19 1700 06/28/19 0008 06/28/19 0737  BP: 118/74 120/77 112/68 109/66  Pulse: 64 61 67 67  Resp: 20 16 20 18   Temp: 98.3 F (36.8 C) 98.8 F (37.1 C) 98.1 F (36.7 C) 98.5 F (36.9 C)  TempSrc: Oral Oral Oral Oral  SpO2: 98% 97% 99% 99%  Weight:      Height:       General: alert, cooperative and no distress; stopped breast feeding and is now bottle feeding. Discussed with her weaning down off  oxycodone and taking only 1 every 6 hours if needed.  Heart: RRR  without murmur  Lungs; CTAB/ normal respiratory effort  Lochia: appropriate Uterine Fundus: firm Incision: Dressing is clean, dry, and intact DVT Evaluation: No evidence of DVT seen on physical exam.  Labs: Lab Results  Component Value Date   WBC 8.3 06/24/2019   HGB 10.7 (L) 06/24/2019   HCT 31.2 (L) 06/24/2019   MCV 91.5 06/24/2019   PLT 133 (L) 06/24/2019   CMP Latest Ref Rng & Units 06/23/2019  Glucose 70 - 99 mg/dL -  BUN 6 - 20 mg/dL -  Creatinine 0.44 - 1.00 mg/dL 0.58  Sodium 135 - 145 mmol/L -  Potassium 3.5 - 5.1 mmol/L -  Chloride 98 - 111 mmol/L -  CO2 22 - 32 mmol/L -  Calcium 8.9 - 10.3 mg/dL -  Total Protein 6.5 - 8.1 g/dL -  Total Bilirubin 0.3 - 1.2 mg/dL -  Alkaline Phos 38 - 126 U/L -  AST 15 - 41 U/L -  ALT 0 - 44 U/L -    Discharge instruction:  per After Visit Summary.  Medications:  Allergies as of 06/28/2019      Reactions   Hydrocodone-acetaminophen Itching   Morphine And Related Itching   Naloxone Hcl    Other Swelling   SUBOXONE AND NALOXONE     Peanuts [peanut Oil] Swelling   Penicillins Hives   Did it involve swelling of the face/tongue/throat, SOB, or low BP? Unknown Did it involve sudden or severe rash/hives, skin peeling, or any reaction on the inside of your mouth or nose? Unknown Did you need to seek medical attention at a hospital or doctor's office? Yes When did it last happen?childhood reaction. If all above answers are "NO", may proceed with cephalosporin use.      Medication List    TAKE these medications   acetaminophen 500 MG tablet Commonly known as: TYLENOL Take 2 tablets (1,000 mg total) by mouth every 6 (six) hours as needed for mild pain, moderate pain, fever or headache.   amphetamine-dextroamphetamine 20 MG 24 hr capsule Commonly known as: ADDERALL XR Take 20 mg by mouth daily.   buprenorphine 8 MG Subl SL tablet Commonly known as:  SUBUTEX Place 8 mg under the tongue 3 (three) times daily.   busPIRone 15 MG tablet Commonly known as: BUSPAR Take 15 mg by mouth 2 (two) times daily.   calcium carbonate 500 MG chewable tablet Commonly known as: TUMS - dosed in mg elemental calcium Chew 1-2 tablets by mouth 3 (three) times daily.   CitraNatal Assure 35-1 & 300 MG tablet Take 2 tablets by mouth daily.   clonazePAM 0.5 MG tablet Commonly known as: KLONOPIN Take 0.5 mg by mouth 2 (two) times daily.   escitalopram 20 MG tablet Commonly known as: LEXAPRO Take 20 mg by mouth daily.   esomeprazole 20 MG capsule Commonly known as: NexIUM Take 1 capsule (20 mg total) by mouth daily as needed. What changed:   when to take this  reasons to take this   ibuprofen 600 MG tablet Commonly known as: ADVIL Take 1 tablet (600 mg total) by mouth every 6 (six) hours as needed for mild pain, moderate pain or cramping.   oxyCODONE 5 MG immediate release tablet Commonly known as: Oxy IR/ROXICODONE Take 1 tablet every 6 hours prn pain   Prenatal Vitamins 28-0.8 MG Tabs Take 1 tablet by mouth daily.   traZODone 50 MG tablet Commonly known as: DESYREL Take 50 mg by mouth at bedtime.       Diet: routine diet  Activity: Advance as tolerated. Pelvic rest for 6 weeks.   Outpatient follow up: Follow-up Information    Nadara Mustard, MD. Go in 1 week(s).   Specialty: Obstetrics and Gynecology Why: For post op appontment and to insert Nexplanon Contact information: [address] [place] [address] [phone]             Postpartum contraception: Nexplanon Rhogam Given postpartum: no Rubella vaccine given postpartum: no Varicella vaccine given postpartum: no, patient refused TDaP given antepartum or postpartum: Yes  Newborn Data: Live born female Rayleigh Birth Weight: 6 lb 12.3 oz (3070 g) APGAR: 8, 9  Newborn Delivery   Birth date/time: 06/23/2019 08:12:00 Delivery type: C-Section, Low  Transverse Trial of labor: No C-section categorization: Repeat       Baby Feeding: Bottle  Disposition:home with mother  SIGNED:  Farrel Conners, CNM 06/28/2019 2:25 PM

## 2019-06-24 LAB — CBC
HCT: 31.2 % — ABNORMAL LOW (ref 36.0–46.0)
Hemoglobin: 10.7 g/dL — ABNORMAL LOW (ref 12.0–15.0)
MCH: 31.4 pg (ref 26.0–34.0)
MCHC: 34.3 g/dL (ref 30.0–36.0)
MCV: 91.5 fL (ref 80.0–100.0)
Platelets: 133 10*3/uL — ABNORMAL LOW (ref 150–400)
RBC: 3.41 MIL/uL — ABNORMAL LOW (ref 3.87–5.11)
RDW: 13.8 % (ref 11.5–15.5)
WBC: 8.3 10*3/uL (ref 4.0–10.5)
nRBC: 0 % (ref 0.0–0.2)

## 2019-06-24 MED ORDER — OXYCODONE HCL 5 MG PO TABS
5.0000 mg | ORAL_TABLET | Freq: Four times a day (QID) | ORAL | Status: DC | PRN
  Administered 2019-06-28: 5 mg via ORAL
  Filled 2019-06-24 (×2): qty 1

## 2019-06-24 MED ORDER — OXYCODONE HCL 5 MG PO TABS
10.0000 mg | ORAL_TABLET | Freq: Four times a day (QID) | ORAL | Status: DC | PRN
  Administered 2019-06-24 – 2019-06-28 (×15): 10 mg via ORAL
  Filled 2019-06-24 (×15): qty 2

## 2019-06-24 MED ORDER — ACETAMINOPHEN 500 MG PO TABS
1000.0000 mg | ORAL_TABLET | Freq: Four times a day (QID) | ORAL | Status: DC | PRN
  Administered 2019-06-24 – 2019-06-28 (×15): 1000 mg via ORAL
  Filled 2019-06-24 (×15): qty 2

## 2019-06-24 MED ORDER — VARICELLA VIRUS VACCINE LIVE 1350 PFU/0.5ML IJ SUSR
0.5000 mL | Freq: Once | INTRAMUSCULAR | Status: DC
  Filled 2019-06-24: qty 0.5

## 2019-06-24 NOTE — Progress Notes (Signed)
POD #1 Repeat LTCS Subjective:  Tolerating a regular diet. Passing a little flatus. Foley removed this AM. Breast feeding. Complains of  sore nipples.  Taking Subutex, Roxicodone, and Toradol for pain. Is also on Clonopine and Adderall and Lexapro Objective:  Blood pressure 110/65, pulse 60, temperature 97.9 F (36.6 C), temperature source Oral, resp. rate 20, height 5\' 3"  (1.6 m), weight 85.7 kg, SpO2 99 %, unknown if currently breastfeeding. Urine output: 918 ml in 24 hours.  General: Holding baby and breast feeding, in NAD Pulmonary: no increased work of breathing/ CTAB Heart: RRR without murmur Abdomen: non-distended, appropriately tender, fundus firm at level of umbilicus Incision: Honeycomb dressing C+D+I, ON Q intact Extremities: no edema, no erythema, no tenderness  Results for orders placed or performed during the hospital encounter of 06/23/19 (from the past 72 hour(s))  Type and screen [facility]     Status: None   Collection Time: 06/23/19  4:53 AM  Result Value Ref Range   ABO/RH(D) A POS    Antibody Screen NEG    Sample Expiration      06/26/2019,2359 Performed at [facility], [address] [place], [place]   CBC     Status: None   Collection Time: 06/23/19  4:54 AM  Result Value Ref Range   WBC 9.9 4.0 - 10.5 K/uL   RBC 4.18 3.87 - 5.11 MIL/uL   Hemoglobin 13.0 12.0 - 15.0 g/dL   HCT 08/21/19 60.4 - 54.0 %   MCV 89.7 80.0 - 100.0 fL   MCH 31.1 26.0 - 34.0 pg   MCHC 34.7 30.0 - 36.0 g/dL   RDW 98.1 19.1 - 47.8 %   Platelets 202 150 - 400 K/uL   nRBC 0.0 0.0 - 0.2 %    Comment: Performed at [facility], [address] [place], [place]  CBC     Status: Abnormal   Collection Time: 06/23/19  1:41 PM  Result Value Ref Range   WBC 13.8 (H) 4.0 - 10.5 K/uL   RBC 4.01 3.87 - 5.11 MIL/uL   Hemoglobin 12.3 12.0 - 15.0 g/dL   HCT 08/21/19 86.5 - 78.4 %   MCV 89.8 80.0 - 100.0 fL   MCH 30.7 26.0 - 34.0 pg   MCHC 34.2 30.0 - 36.0 g/dL   RDW 69.6 29.5 - 28.4 %   Platelets 154 150 - 400 K/uL   nRBC 0.0 0.0 - 0.2 %    Comment: Performed at [facility], [address] [place], [place]  Creatinine, serum     Status: None   Collection Time: 06/23/19  1:41 PM  Result Value Ref Range   Creatinine, Ser 0.58 0.44 - 1.00 mg/dL   GFR calc non Af Amer >60 >60 mL/min   GFR calc Af Amer >60 >60 mL/min    Comment: Performed at [facility], [address] [place], [place]  CBC     Status: Abnormal   Collection Time: 06/24/19  6:17 AM  Result Value Ref Range   WBC 8.3 4.0 - 10.5 K/uL   RBC 3.41 (L) 3.87 - 5.11 MIL/uL   Hemoglobin 10.7 (L) 12.0 - 15.0 g/dL   HCT 08/22/19 (L) 66.4 - 40.3 %   MCV 91.5 80.0 - 100.0 fL   MCH 31.4 26.0 - 34.0 pg   MCHC 34.3 30.0 - 36.0 g/dL   RDW 47.4 25.9 - 56.3 %   Platelets 133 (L) 150 -  400 K/uL   nRBC 0.0 0.0 - 0.2 %    Comment: Performed at [facility], [address], [place], [place] [address]     Assessment:   29 y.o. [phone] postoperativeday # 1-Stable s/p repeat LTCS  Trial of voiding  Ambulate today  Support and assist with breast feeding Hx of anxiety/depression/ substance use  Social Services consult  Continue Subutex  Baby will need observation for NAS    Plan:  1) Acute blood loss anemia - hemodynamically stable and asymptomatic - po vitamins with iron  2)A POS/ RI/ Varicella NI-offer Varivax prior to discharge  3) TDAP UTD  4) Breast/Contraception-Nexplanon  5) Disposition-probably on POD #4-since baby on Eat, Sleep, Console program  Dalia Heading, [place]

## 2019-06-24 NOTE — Anesthesia Postprocedure Evaluation (Signed)
Anesthesia Post Note  Patient: Tammy [name]  Procedure(s) Performed: CESAREAN SECTION (N/A )  Patient location during evaluation: Mother Baby Anesthesia Type: Spinal Level of consciousness: oriented and awake and alert Pain management: pain level controlled Vital Signs Assessment: post-procedure vital signs reviewed and stable Respiratory status: spontaneous breathing and respiratory function stable Cardiovascular status: blood pressure returned to baseline and stable Postop Assessment: no headache, no backache, no apparent nausea or vomiting and able to ambulate Anesthetic complications: no     Last Vitals:  Vitals:   06/24/19 0755 06/24/19 0900  BP: 110/65   Pulse: 60   Resp: 20   Temp: 36.6 C   SpO2:  99%    Last Pain:  Vitals:   06/24/19 1311  TempSrc:   PainSc: 7                  [name]

## 2019-06-24 NOTE — Clinical Social Work Maternal (Signed)
CLINICAL SOCIAL WORK MATERNAL/CHILD NOTE  Patient Details  Name: Tammy [name] MRN: [id number] Date of Birth: 1990/05/31  Date:  06/24/2019  Clinical Social Worker Initiating Note:  Jhonnie Garner, RNCM Date/Time: Initiated:  06/24/19/0930     Child's Name:  [name]   Biological Parents:  Mother   Need for Interpreter:  None   Reason for Referral:  Behavioral Health Concerns, Other (Comment)(questionable CPS involvement)   Address:  [place] [address]    Phone number:  [phone] (home)     Additional phone number:   Household Members/Support Persons (HM/SP):   Household Member/Support Person 1   HM/SP Name Relationship DOB or Age  HM/SP -1  FOB    HM/SP -2        HM/SP -3        HM/SP -4        HM/SP -5        HM/SP -6        HM/SP -7        HM/SP -8          Natural Supports (not living in the home):  Immediate Family   Professional Supports: Organized support group (Comment)("[facility] [employer] [facility]")   Employment: Unemployed   Type of Work:     Education:  Programmer, systems   Homebound arranged:    Museum/gallery curator Resources:  Medicaid   Other Resources:  Physicist, medical , [place] Considerations Which May Impact Care:    Strengths:  Pediatrician chosen   Psychotropic Medications:         Pediatrician:    Ambulance person List:   [place])  [place]      Pediatrician Fax Number:    Risk Factors/Current Problems:  Mental Health Concerns    Cognitive State:  Alert    Mood/Affect:  Apprehensive    CSW Assessment:  RNCM assessed patient at bedside. Patient sitting up in bed holding child and CNM at bedside completing assessment. RNCM introduced self and described nature of visit. Patient reports to feeling pretty well today but that this was definitely her most painful c-section. Patient reports  that this is her last child, she decided not to have tubes tied but does report she plans on getting inplant. RNCM discussed current living situation as well as her children. Patient reports that she has had open CPS case in the past and she lost her children for a year but that she was just able to get them back last September. Patient reports that she takes her medications as she is supposed to and feels she is doing well with her depression, denies ever having issues with PPD but does understand what it is. Patient reports that she goes to "[facility]" for her counseling and that this is who prescribes her medications. Patient reports that she has all necessary equipment at home to take care of her daughter and has no other needs at this time.   RNCM placed call to [place] to follow up on questionable open CPS case. Spoke with Chrissie Noa who initially reported he was unable to give any information regarding details of reports but that he would return call to the CM. Received return call within a few minutes from [place] who reported that case that had been opened was now closed and they had no  further concerns at this time. Bedside nurse notified.   CSW Plan/Description:  No Further Intervention Required/No Barriers to Discharge    Trenton Founds, RN 06/24/2019, 1:12 PM

## 2019-06-24 NOTE — Lactation Note (Signed)
This note was copied from a baby's chart. Lactation Consultation Note  Patient Name: Tammy [name] Today's Date: 06/24/2019 Reason for consult: Follow-up assessment;Mother's request  LC entered room for follow-up assessment and per mother of baby's request to be set up with a DEBP. Upon entering room, mother of baby was awake and holding baby. Baby was showing significant hunger cues. LC suggested to mother of baby to go ahead and get baby latched on in order to feed baby on demand.   Mother of baby has everted nipples at rest and while stimulated. Mother of baby actively nursed baby on the right breast in cradle hold for 10 minutes. Mother of baby, then requested LC to help her get set up with DEBP. LC got pump kit and set up DEBP, while mother of baby brought baby back skin to skin. Mother of baby requested to just pump the right breast at this time, after the baby was brought skin to skin on the left side. LC assisted with getting the flange to fit properly. LC went over breastfeeding basics, and hand expressions. As well as hands on pumping. Mother of baby was able to sustain a proper fit into the standard size 24mm flange. LC encouraged mother of baby to follow baby's hunger cues and feed on demand, then follow up with a pump session for relief. Mother of baby reports that she would like to breastfeed for 2 weeks, and then at that point would like to introduce formula. LC taught paced bottle feeding. Mother of baby was able to get 20mL of expressed breastmilk into a bottle, LC encouraged mother of baby to feed it to baby and gave mother of baby the option to feed the colostrum through a finger feed, spoon, curved tip syringe or bottle. LC explained them all. Mother of baby preferred the bottle, as she does hope to breast and bottle feed in the future. LC was then able to show mother of baby how to properly position baby and hold the bottle of the expressed breastmilk. Baby took all 20mL of  expressed breast milk.   LC encouraged mother of baby to follow baby's hunger cues. Mother of baby felt confident with the breastfeeding basics. Mother of baby reports that she is a WIC client through [place]. LC to fax referral over. LC encouraged mother of baby to call out to the lactation number for continued support, if needed. LC to follow up for follow-up assessment on 06/25/2019.  Maternal Data Has patient been taught Hand Expression?: Yes Does the patient have breastfeeding experience prior to this delivery?: No  Feeding Feeding Type: Breast Fed  LATCH Score                   Interventions Interventions: Breast feeding basics reviewed;Breast massage;Hand express;Breast compression;DEBP  Lactation Tools Discussed/Used WIC Program: Yes Pump Review: Setup, frequency, and cleaning;Milk Storage Initiated by:: SNA Date initiated:: 06/24/19   Consult Status Consult Status: Follow-up Date: 06/25/19 Follow-up type: In-patient     N  06/24/2019, 2:39 PM    

## 2019-06-25 MED ORDER — POLYETHYLENE GLYCOL 3350 17 G PO PACK
17.0000 g | PACK | Freq: Every day | ORAL | Status: DC
  Administered 2019-06-25 – 2019-06-27 (×3): 17 g via ORAL
  Filled 2019-06-25 (×4): qty 1

## 2019-06-25 NOTE — Progress Notes (Signed)
Subjective:   She is doing well. She is ambulating in the room. She is urinating without issue. Normal lochia. Reports limited flatus one time. She denies BM. Having moderate pain.   Objective:  Blood pressure 112/67, pulse 63, temperature 98.4 F (36.9 C), temperature source Oral, resp. rate 18, height 5\' 3"  (1.6 m), weight 85.7 kg, SpO2 99 %, unknown if currently breastfeeding.  General: NAD Pulmonary: no increased work of breathing Abdomen: non-distended, non-tender, fundus firm at level of umbilicus Incision: clean, dry, intact.  Extremities: no edema, no erythema, no tenderness  Results for orders placed or performed during the hospital encounter of 06/23/19 (from the past 72 hour(s))  Type and screen [place]     Status: None   Collection Time: 06/23/19  4:53 AM  Result Value Ref Range   ABO/RH(D) A POS    Antibody Screen NEG    Sample Expiration      06/26/2019,2359 Performed at [facility], [address] [place], [place] [address]   CBC     Status: None   Collection Time: 06/23/19  4:54 AM  Result Value Ref Range   WBC 9.9 4.0 - 10.5 K/uL   RBC 4.18 3.87 - 5.11 MIL/uL   Hemoglobin 13.0 12.0 - 15.0 g/dL   HCT 37.5 36.0 - 46.0 %   MCV 89.7 80.0 - 100.0 fL   MCH 31.1 26.0 - 34.0 pg   MCHC 34.7 30.0 - 36.0 g/dL   RDW 13.5 11.5 - 15.5 %   Platelets 202 150 - 400 K/uL   nRBC 0.0 0.0 - 0.2 %    Comment: Performed at [facility], [address] [place], [place] [address]  CBC     Status: Abnormal   Collection Time: 06/23/19  1:41 PM  Result Value Ref Range   WBC 13.8 (H) 4.0 - 10.5 K/uL   RBC 4.01 3.87 - 5.11 MIL/uL   Hemoglobin 12.3 12.0 - 15.0 g/dL   HCT 36.0 36.0 - 46.0 %   MCV 89.8 80.0 - 100.0 fL   MCH 30.7 26.0 - 34.0 pg   MCHC 34.2 30.0 - 36.0 g/dL   RDW 13.6 11.5 - 15.5 %   Platelets 154 150 - 400 K/uL   nRBC 0.0 0.0 - 0.2 %    Comment: Performed at [facility], [address] [place],  [place] [address]  Creatinine, serum     Status: None   Collection Time: 06/23/19  1:41 PM  Result Value Ref Range   Creatinine, Ser 0.58 0.44 - 1.00 mg/dL   GFR calc non Af Amer >60 >60 mL/min   GFR calc Af Amer >60 >60 mL/min    Comment: Performed at [facility], [address] [place], [place] [address]  CBC     Status: Abnormal   Collection Time: 06/24/19  6:17 AM  Result Value Ref Range   WBC 8.3 4.0 - 10.5 K/uL   RBC 3.41 (L) 3.87 - 5.11 MIL/uL   Hemoglobin 10.7 (L) 12.0 - 15.0 g/dL   HCT 31.2 (L) 36.0 - 46.0 %   MCV 91.5 80.0 - 100.0 fL   MCH 31.4 26.0 - 34.0 pg   MCHC 34.3 30.0 - 36.0 g/dL   RDW 13.8 11.5 - 15.5 %   Platelets 133 (L) 150 - 400 K/uL   nRBC 0.0 0.0 - 0.2 %    Comment: Performed at [facility], [address] [place], [place] [address]  Assessment:   29 y.o. [id number] postoperativeday # 2   Plan:  1) Acute blood loss anemia - hemodynamically stable and asymptomatic - po ferrous sulfate  2) Blood Type --/--/A POS (02/02 3437) / Rubella 2.07 (06/30 1351) / Varicella non immune- varivax before discharge  3) TDAP up to date  4) Breast feeding  5)Contraception- not discussed  6) Disposition- continue current care  Adelene Idler MD [facility], [facility] 06/25/2019 12:02 PM

## 2019-06-25 NOTE — Discharge Instructions (Signed)
Please call your doctor or return to the ER if you experience any chest pains, shortness of breath, dizziness, visual changes, severe headache (unrelieved by pain meds), fever greater than 101, any heavy bleeding (saturating more than 1 pad per hour), large clots, or foul smelling discharge, any worsening abdominal pain and cramping that is not controlled by pain medication, any calf/leg pain or redness, any breast concerns (redness/pain), or any signs of postpartum depression. No tampons, enemas, douches, or sexual intercourse for 6 weeks. Also avoid tub baths, hot tubs, or swimming for 6 weeks.    Check your incision daily for any signs of infection such as redness, warmth, swelling, increased pain, or pus/foul smelling drainage  Activity: do not lift over 10 lbs for 6 weeks No driving for 1-2 weeks  Pelvic rest for 6 weeks  

## 2019-06-25 NOTE — Lactation Note (Signed)
This note was copied from a baby's chart. Lactation Consultation Note  Patient Name: Tammy [name] [id number] Date: 06/25/2019 Reason for consult: Follow-up assessment;Mother's request;1st time breastfeeding(assistance with pump set up)  RN called for LC to assist with cleaning and pump set-up. Baby was actively breastfeeding in cradle position on left breast when LC entered, mom on FaceTime with her mother. LC asked what mom needed assistance with, answered pumping of opposite breast. LC cleaned parts and pieces of pump, dried, and put together and brought pump over beside to mom. LC assisted with placement of pump onto breast, size 32mm breast flange used, and started the pump. Mom was able to voice when pump suction was too high, and nob was turned down to comfortable level. Mom kept asking if she was doing it correctly, LC kept advising to leave breast flange in place, and allow gravity and position of being upright in bed to help. Milk did begin to flow while in the stimulation phase of pumping. Mom encouraged to watch baby with breastfeeding, and to voice help to FOB if needed. LC will check back in later.   Maternal Data Formula Feeding for Exclusion: No Has patient been taught Hand Expression?: Yes Does the patient have breastfeeding experience prior to this delivery?: No  Feeding Feeding Type: Breast Fed  LATCH Score                   Interventions Interventions: Breast feeding basics reviewed;DEBP  Lactation Tools Discussed/Used WIC Program: Yes Pump Review: Setup, frequency, and cleaning;Milk Storage Initiated by:: SNA Date initiated:: 06/24/19   Consult Status Consult Status: Follow-up Date: 06/25/19 Follow-up type: Call as needed    Danford Bad 06/25/2019, 10:15 AM

## 2019-06-26 MED ORDER — BISACODYL 10 MG RE SUPP: 10.0000 mg | Freq: Every day | RECTAL | Status: DC | PRN

## 2019-06-26 NOTE — Progress Notes (Signed)
POD #3 Repeat LTCS Subjective:  Tolerating a regular diet. Has passed flatus twice, none today. No BM.  Taking Subutex, Roxicodone, and Motrin for pain. Is also on Clonopine and Adderall and Lexapro. Wants to be discharged at 14:45 tomorrow so she can pick up prescriptions then come back to stay with baby for the Eat, Sleep Console Program. Objective:   Vital Signs: BP (!) 117/91 (BP Location: Right Arm)   Pulse 76   Temp 98.6 F (37 C) (Oral)   Resp 18   Ht 5\' 3"  (1.6 m)   Wt 85.7 kg   LMP  (LMP Unknown)   SpO2 100%   Breastfeeding Unknown   BMI 33.48 kg/m  General: Holding baby and in NAD Pulmonary: no increased work of breathing/ CTAB Heart: RRR without murmur Abdomen: soft, non-distended,  fundus firm at level of umbilicus-1FB, bowel sounds present Incision: Honeycomb dressing C+D+I, ON Q intact. There is some serosanguinous drainage on the ON Q dressing Extremities: no edema, no erythema, no tenderness  Results for orders placed or performed during the hospital encounter of 06/23/19 (from the past 72 hour(s))  Type and screen [facility]     Status: None   Collection Time: 06/23/19  4:53 AM  Result Value Ref Range   ABO/RH(D) A POS    Antibody Screen NEG    Sample Expiration      06/26/2019,2359 Performed at [facility], [address] [place], [place]   CBC     Status: None   Collection Time: 06/23/19  4:54 AM  Result Value Ref Range   WBC 9.9 4.0 - 10.5 K/uL   RBC 4.18 3.87 - 5.11 MIL/uL   Hemoglobin 13.0 12.0 - 15.0 g/dL   HCT 08/21/19 59.5 - 63.8 %   MCV 89.7 80.0 - 100.0 fL   MCH 31.1 26.0 - 34.0 pg   MCHC 34.7 30.0 - 36.0 g/dL   RDW 75.6 43.3 - 29.5 %   Platelets 202 150 - 400 K/uL   nRBC 0.0 0.0 - 0.2 %    Comment: Performed at [facility], [address] [place], [place]  CBC     Status: Abnormal   Collection Time: 06/23/19  1:41 PM  Result Value Ref Range   WBC 13.8 (H) 4.0 - 10.5 K/uL   RBC 4.01 3.87 - 5.11 MIL/uL   Hemoglobin 12.3 12.0 - 15.0 g/dL   HCT 08/21/19 63.0 - 16.0 %   MCV 89.8 80.0 - 100.0 fL   MCH 30.7 26.0 - 34.0 pg   MCHC 34.2 30.0 - 36.0 g/dL   RDW 10.9 32.3 - 55.7 %   Platelets 154 150 - 400 K/uL   nRBC 0.0 0.0 - 0.2 %    Comment: Performed at [facility], [address] [place], [place]  Creatinine, serum     Status: None   Collection Time: 06/23/19  1:41 PM  Result Value Ref Range   Creatinine, Ser 0.58 0.44 - 1.00 mg/dL   GFR calc non Af Amer >60 >60 mL/min   GFR calc Af Amer >60 >60 mL/min    Comment: Performed at [facility], [address] [place], [place]  CBC     Status: Abnormal   Collection Time: 06/24/19  6:17 AM  Result Value Ref Range   WBC 8.3 4.0 - 10.5 K/uL   RBC 3.41 (L) 3.87 - 5.11 MIL/uL   Hemoglobin 10.7 (L) 12.0 -  15.0 g/dL   HCT 31.2 (L) 36.0 - 46.0 %   MCV 91.5 80.0 - 100.0 fL   MCH 31.4 26.0 - 34.0 pg   MCHC 34.3 30.0 - 36.0 g/dL   RDW 13.8 11.5 - 15.5 %   Platelets 133 (L) 150 - 400 K/uL   nRBC 0.0 0.0 - 0.2 %    Comment: Performed at [facility], [address], [place], [place] [address]     Assessment:   29 y.o. [id number] postoperativeday # 3-Stable s/p repeat LTCS Slow return of normal GI function  Encouraged ambulating around the nurses station to help stimulate the bowel. Also encouraged increasing water intake. Is taking Miralax and Senakot.  Support and assist with breast feeding Hx of anxiety/depression/ substance use  Social Services consult appreciated  Continue Subutex  Baby will need continued observation for NAS    Plan:  1) Acute blood loss anemia - hemodynamically stable and asymptomatic - po vitamins with iron  2)A POS/ RI/ Varicella NI-offer Varivax prior to discharge  3) TDAP UTD  4) Breast/Contraception-Nexplanon  5) Disposition-tomorrow on POD #4-since baby on Eat, Sleep, Console program  [place], [place]

## 2019-06-26 NOTE — Progress Notes (Signed)
Pt refused vitals from Nursing Student. Pt states "I'm sleeping. Come back Later!" RN and Nurse Tech notified.

## 2019-06-27 MED ORDER — PRENATAL VITAMINS 28-0.8 MG PO TABS: 1.0000 | ORAL_TABLET | Freq: Every day | ORAL | 11 refills | Status: AC

## 2019-06-27 MED ORDER — OXYCODONE HCL 5 MG PO TABS: ORAL_TABLET | ORAL | 0 refills | Status: DC

## 2019-06-27 MED ORDER — ESOMEPRAZOLE MAGNESIUM 20 MG PO CPDR: 20.0000 mg | DELAYED_RELEASE_CAPSULE | Freq: Every day | ORAL | 2 refills | Status: AC | PRN

## 2019-06-27 MED ORDER — IBUPROFEN 600 MG PO TABS: 600.0000 mg | ORAL_TABLET | Freq: Four times a day (QID) | ORAL | 1 refills | Status: AC | PRN

## 2019-06-27 MED ORDER — ACETAMINOPHEN 500 MG PO TABS: 1000.0000 mg | ORAL_TABLET | Freq: Four times a day (QID) | ORAL | 1 refills | Status: AC | PRN

## 2019-06-27 NOTE — Progress Notes (Signed)
Patient called RN and stated that her pharmacy called her and said the provider "needs to fill out a prior authorization form" for patient to pick up her oxycodone. RN updated CNM, Maebelle Munroe.

## 2019-06-27 NOTE — Progress Notes (Signed)
Patient sleeping with infant in the bed with her. RN discussed safe sleep with patient and asked that patient place infant in the bassinet if she is feeling sleepy. Patient just states "I'm not sleeping." Safe sleep education needs continuous reinforcement.

## 2019-06-27 NOTE — Progress Notes (Addendum)
POD #4  Repeat LTCS Subjective:  Tolerating a regular diet. Passing more flatus. Wanted to be discharged today. RX written for oxycodone, Motrin and Tylenol, but need PA from [facility] for oxycodone. Trying to get PA approved, delaying discharge. Currently taking 2 oxycodone every 6 hours. Advised needs to start weaning down off oxycodone.  Is pumping and feeding baby breast milk.   Objective:  Blood pressure 120/77, pulse 61, temperature 98.8 F (36.9 C), temperature source Oral, resp. rate 16, height 5\' 3"  (1.6 m), weight 85.7 kg, SpO2 97 %, unknown if currently breastfeeding.  General: WF in  NAD Pulmonary: no increased work of breathing/ CTAB Heart: RRR without murmur Abdomen: non-distended, tender to palpation in lower abdomen,  fundus firm Bowel sounds present Incision: Dressing C+D+I. ON Q pump is empty. ON Q pump removed intact and dressing placed over site.  Extremities: no edema, no erythema, no tenderness  Assessment:   29 y.o. 30 postoperativeday # 4-stable s/p repeat Cesarean section  Given discharge instructions  Awaiting PA approval of oxycodone Hx of anxiety/ depression/ substance abuse  Continue Subutex  Advised to start weaning down on oxycodone  Baby will need to stay another day for Eat, Sleep, Console    Patient to room in with baby if discharged.     Plan:  1) Acute blood loss anemia (mild) - hemodynamically stable and asymptomatic - po vitamins with irone  2) A POS/ RI/ VNI-offer Varivax prior to discharge  3) TDAP UTD  4) Breast/Bottle/Contraception-Nexplanon  5) Disposition- Discharge cancelled because unable to get PA for oxycodone at this time and her pharmacy has closed. . Will try to get PA for oxycodone tomorrow from [facility]   HOSP HERMANOS MELENDEZ, CNM

## 2019-06-27 NOTE — Progress Notes (Signed)
Patient refuses assessment at this time. Patient also refuses assessment on infant. RN explained need for assessment but patient still refuses. RN to return and assess patient and infant around 10 when patient's meds are due. Patient agrees.

## 2019-06-27 NOTE — Progress Notes (Signed)
Per patient, patient's pharmacy called and stated provider needs to fill out prior authorization for oxycodone prescription. CNM, Maebelle Munroe, updated. Provider faxed authorization form and called medicaid (as pharmacy stated). Approval for prescription pending. Issue unresolved tonight. Provider canceled patient discharge. RN to involve care management tomorrow to help with this issue.

## 2019-06-28 MED ORDER — OXYCODONE HCL 5 MG PO TABS: ORAL_TABLET | ORAL | 0 refills | Status: DC

## 2019-06-28 NOTE — Progress Notes (Signed)
Reviewed D/C instructions with pt and FOB. Pt verbalized understanding of teaching. Discharged to home via W/C. Pt to schedule f/u appt.

## 2019-06-28 NOTE — Social Work (Signed)
Social worker received social work consult due to concerns from medical providers during her stay at the hospital. Caretakers (RN, CNA's and PCP) observed mother falling asleep while feeding newborn, sleeping with her newborn in the bed, inadequate feeding.  Mother of patient was not able to provide nurses with feeding schedule.  She did not know feeding times to report to nurses and did know how much milk her newborn drank.  It was also observed over several shift RH (see notes) that mother of patient continued to sleep with newborn in the bed.  At one-point nurse notice that mother of patient has her newborn between herself and the rail.  A bull bottle of mil was next to the baby.  When the nurse ask the mother of this newborn if her newborn has eaten. She reported that "I don't know."  Mother of patient has a history ADHD on stimulant therapy, Anxiety and Depression, and substance abuse disorder, on subutex and Klonazepam during pregnancy.  Mother of patient currently in treatment at [facility].  Follow-up appointment scheduled for July 09, 2019.   CPS report initiated with [place] Department of Social Services  Larwance Rote, MSW, LCSW  [phone] 8am-6pm

## 2019-06-28 NOTE — Lactation Note (Signed)
This note was copied from a baby's chart. Lactation Consultation Note  Patient Name: Tammy [name] [id number] Date: 06/28/2019   This is NAS baby.  When spoke to mom earlier, she denies needing lactation help with nursing or pumping reporting she knows how to do both.  Mom did give me permission to touch her breasts.  They were both hard and obviously full of milk.  Could hand express and spray transitional/mature breast milk.  Demonstrated how to massage hard, lumpy areas in breast.  Mom says she will either breast feed or pump in the next hour.  Lactation community resource hand out with contact numbers given and reviewed support groups and out patient lactation consults if needed encouraging mom to call with questions, concerns or assistance.  Called mom after she was discharged to let her know that she left her breast milk in refrigerator (approximately 7 ounces) and see when she might be able to pick it up.  She reports that she will pick up tomorrow.  Told mom that she could just call us when she gets [place] the medical mall and we can bring her milk down to her.  Tried to give her direct line of lactation, but she says she already knows who to call and hung up.  Maternal Data    Feeding    LATCH Score                   Interventions    Lactation Tools Discussed/Used     Consult Status      Louis Meckel 06/28/2019, 2:15 PM

## 2019-06-29 ENCOUNTER — Telehealth: Payer: Self-pay | Admitting: Obstetrics & Gynecology

## 2019-06-29 NOTE — Telephone Encounter (Signed)
Patient is schedule due to transportation issue for 07/13/19 at 3:50 with [facility] for Nexplanon placement and incision check

## 2019-06-29 NOTE — Telephone Encounter (Signed)
Noted. Will order to arrive by apt date/time. 

## 2019-07-08 ENCOUNTER — Ambulatory Visit: Payer: Medicaid Other | Admitting: Obstetrics & Gynecology

## 2019-07-10 NOTE — Telephone Encounter (Signed)
Nexplanon reserved for this patient. 

## 2019-07-13 ENCOUNTER — Ambulatory Visit: Payer: Medicaid Other | Admitting: Obstetrics & Gynecology

## 2019-07-13 NOTE — Telephone Encounter (Signed)
Patient reschedule to 07/16/19 with [facility]

## 2019-07-16 ENCOUNTER — Other Ambulatory Visit: Payer: Self-pay

## 2019-07-16 ENCOUNTER — Ambulatory Visit (INDEPENDENT_AMBULATORY_CARE_PROVIDER_SITE_OTHER): Payer: Medicaid Other | Admitting: Obstetrics & Gynecology

## 2019-07-16 ENCOUNTER — Encounter: Payer: Self-pay | Admitting: Obstetrics & Gynecology

## 2019-07-16 VITALS — BP 120/80 | Ht 67.0 in | Wt 169.0 lb

## 2019-07-16 DIAGNOSIS — Z98891 History of uterine scar from previous surgery: Secondary | ICD-10-CM

## 2019-07-16 DIAGNOSIS — Z30017 Encounter for initial prescription of implantable subdermal contraceptive: Secondary | ICD-10-CM

## 2019-07-16 NOTE — Progress Notes (Signed)
  Postoperative Follow-up Patient presents post op from recent Cesarean Section performed for Elective repeat, 3 weeks ago.   Subjective: Patient reports marked improvement in her immediate post op symptoms. Eating a regular diet without difficulty. Pain is controlled with current analgesics. Medications being used: acetaminophen and ibuprofen (OTC).  Activity: normal activities of daily living. Patient reports additional symptom's since surgery of appropriate lochia, no signs of depression, and no signs of mastitis.  Objective: BP 120/80   Ht 5\' 7"  (1.702 m)   Wt 169 lb (76.7 kg)   LMP  (LMP Unknown)   BMI 26.47 kg/m  Physical Exam Constitutional:      General: She is not in acute distress.    Appearance: She is well-developed.  Cardiovascular:     Rate and Rhythm: Normal rate.  Pulmonary:     Effort: Pulmonary effort is normal.  Abdominal:     General: There is no distension.     Palpations: Abdomen is soft.     Tenderness: There is no abdominal tenderness.     Comments: Incision Healing Well   Musculoskeletal:        General: Normal range of motion.  Neurological:     Mental Status: She is alert and oriented to person, place, and time.     Cranial Nerves: No cranial nerve deficit.  Skin:    General: Skin is warm and dry.     Assessment: s/p : Cesarean Section for Elective repeat stable  Plan: Patient has done well after her Cesarean Section with no apparent complications.  I have discussed the post-operative course to date, and the expected progress moving forward.  The patient understands what complications to be concerned about.  I will see the patient in routine follow up, or sooner if needed.    Activity plan: No heavy lifting.  Pelvic rest. She desires Nexplanon for postpartum contraception.    Nexplanon Insertion  Patient given informed consent, signed copy in the chart, time out was performed. Appropriate time out taken.  Patient's RIGHT arm was prepped  and draped in the usual sterile fashion.. The ruler used to measure and mark insertion area.  Pt was prepped with betadine swab and then injected with 3 cc of 2% lidocaine with epinephrine. Nexplanon removed form packaging,  Device confirmed in needle, then inserted full length of needle and withdrawn per handbook instructions.  Pt insertion site covered with steri-strip and a bandage.   Minimal blood loss.  Pt tolerated the procedure well.   Marland Kitchen, MD, [facility], [facility] 07/16/2019  10:18 AM

## 2019-07-16 NOTE — Patient Instructions (Signed)
Nexplanon Instructions After Insertion  Keep bandage clean and dry for 24 hours  May use ice/Tylenol/Ibuprofen for soreness or pain  If you develop fever, drainage or increased warmth from incision site-contact office immediately   

## 2019-08-03 NOTE — Telephone Encounter (Signed)
Nexplanon rcvd/charged 07/16/2019

## 2019-08-10 ENCOUNTER — Ambulatory Visit: Payer: Medicaid Other | Admitting: Obstetrics & Gynecology

## 2020-04-09 ENCOUNTER — Other Ambulatory Visit: Payer: Self-pay

## 2020-04-09 ENCOUNTER — Encounter: Payer: Self-pay | Admitting: Emergency Medicine

## 2020-04-09 ENCOUNTER — Emergency Department
Admission: EM | Admit: 2020-04-09 | Discharge: 2020-04-09 | Disposition: A | Discharge status: Home / Self Care | Payer: Medicaid Other | Attending: Emergency Medicine | Admitting: Emergency Medicine

## 2020-04-09 DIAGNOSIS — J029 Acute pharyngitis, unspecified: Secondary | ICD-10-CM

## 2020-04-09 DIAGNOSIS — R197 Diarrhea, unspecified: Secondary | ICD-10-CM

## 2020-04-09 DIAGNOSIS — Z79899 Other long term (current) drug therapy: Secondary | ICD-10-CM | POA: Diagnosis not present

## 2020-04-09 DIAGNOSIS — Z20822 Contact with and (suspected) exposure to covid-19: Secondary | ICD-10-CM | POA: Diagnosis not present

## 2020-04-09 LAB — RESP PANEL BY RT-PCR (FLU A&B, COVID) ARPGX2
Influenza A by PCR: NEGATIVE
Influenza B by PCR: NEGATIVE
SARS Coronavirus 2 by RT PCR: NEGATIVE

## 2020-04-09 LAB — GROUP A STREP BY PCR: Group A Strep by PCR: NOT DETECTED

## 2020-04-09 NOTE — Discharge Instructions (Addendum)
Your Covid test and strep test are both negative.  Follow-up with your regular doctor as needed.  Return emergency department worsening. Gargle with warm salt water.  Take.  To the counter Tylenol or ibuprofen.  Imodium A-D for diarrhea if needed

## 2020-04-09 NOTE — ED Provider Notes (Signed)
[name] Provider Note  ____________________________________________   First MD Initiated Contact with Patient 04/09/20 1458     (approximate)  I have reviewed the triage vital signs and the nursing notes.   HISTORY  Chief Complaint Sore Throat and Diarrhea    HPI Tammy [name] is a 29 y.o. female presents emergency department complaining of Covid-like symptoms.  States she has had a sore throat and diarrhea.  States hurts very much to swallow.  Symptoms have been ongoing for 2 days.  Patient did not get vaccinated for Covid.  Patient is refusing to wear her mask in the room.  She denies any chest pain or shortness of breath.    Past Medical History:  Diagnosis Date  . ADHD (attention deficit hyperactivity disorder)   . Anxiety   . Asthma   . Depression   . Herpes genitalia   . History of chlamydia   . Substance abuse [name])     Patient Active Problem List   Diagnosis Date Noted  . History of cesarean delivery 06/23/2019  . BV (bacterial vaginosis) 03/30/2019  . Pregnancy complicated by subutex maintenance, antepartum (HCC) 11/19/2018  . Supervision of high risk pregnancy, antepartum 11/18/2018  . Anxiety 03/29/2016  . S/P cesarean section 02/16/2016  . Marijuana use 07/26/2015    Past Surgical History:  Procedure Laterality Date  . CESAREAN SECTION  09/30/2013  . CESAREAN SECTION N/A 02/16/2016   Procedure: REPEAT CESAREAN SECTION;  Surgeon: Hildred Laser, MD;  Location: [facility] ORS;  Service: Obstetrics;  Laterality: N/A;  Female @ 1435 Wt: 6lb 13oz Apgars: 8/9  . CESAREAN SECTION N/A 06/23/2019   Procedure: CESAREAN SECTION;  Surgeon: Nadara Mustard, MD;  Location: [facility] ORS;  Service: Obstetrics;  Laterality: N/A;  . PEG TUBE PLACEMENT Bilateral 2011  . TONSILLECTOMY  2011    Prior to Admission medications   Medication Sig Start Date End Date Taking? Authorizing Provider  acetaminophen (TYLENOL) 500 MG  tablet Take 2 tablets (1,000 mg total) by mouth every 6 (six) hours as needed for mild pain, moderate pain, fever or headache. 06/27/19   Farrel Conners, CNM  amphetamine-dextroamphetamine (ADDERALL XR) 20 MG 24 hr capsule Take 20 mg by mouth daily.    [provider]  buprenorphine (SUBUTEX) 8 MG SUBL SL tablet Place 8 mg under the tongue 3 (three) times daily.     [provider]  busPIRone (BUSPAR) 15 MG tablet Take 15 mg by mouth 2 (two) times daily.  01/12/19   [provider]  calcium carbonate (TUMS - DOSED IN MG ELEMENTAL CALCIUM) 500 MG chewable tablet Chew 1-2 tablets by mouth 3 (three) times daily.    [provider]  clonazePAM (KLONOPIN) 0.5 MG tablet Take 0.5 mg by mouth 2 (two) times daily.  01/12/19   [provider]  escitalopram (LEXAPRO) 20 MG tablet Take 20 mg by mouth daily.  01/12/19   [provider]  esomeprazole (NEXIUM) 20 MG capsule Take 1 capsule (20 mg total) by mouth daily as needed. 06/27/19   Farrel Conners, CNM  ibuprofen (ADVIL) 600 MG tablet Take 1 tablet (600 mg total) by mouth every 6 (six) hours as needed for mild pain, moderate pain or cramping. 06/27/19   Farrel Conners, CNM  Prenat w/o A-FeCbGl-DSS-FA-DHA (CITRANATAL ASSURE) 35-1 & 300 MG tablet Take 2 tablets by mouth daily. 06/04/19   Nadara Mustard, MD  Prenatal Vit-Fe Fumarate-FA (PRENATAL VITAMINS) 28-0.8 MG TABS Take 1 tablet  by mouth daily. 06/27/19   Farrel Conners, CNM  traZODone (DESYREL) 50 MG tablet Take 50 mg by mouth at bedtime. 06/04/19   [provider]    Allergies Hydrocodone-acetaminophen, Morphine and related, Naloxone hcl, Other, Peanuts [peanut oil], and Penicillins  Family History  Problem Relation Age of Onset  . Cancer Maternal Grandmother        thyroid    Social History Social History   Tobacco Use  . Smoking status: Never Smoker  . Smokeless tobacco: Never Used  Vaping Use  . Vaping Use: Never used    Substance Use Topics  . Alcohol use: No  . Drug use: No    Review of Systems  Constitutional: No fever/chills Eyes: No visual changes. ENT: Positive sore throat. Respiratory: Denies cough Cardiovascular: Denies chest pain Gastrointestinal: Denies abdominal pain, positive for diarrhea Genitourinary: Negative for dysuria. Musculoskeletal: Negative for back pain. Skin: Negative for rash. Psychiatric: no mood changes,     ____________________________________________   PHYSICAL EXAM:  VITAL SIGNS: ED Triage Vitals  Enc Vitals Group     BP 04/09/20 1413 125/78     Pulse Rate 04/09/20 1413 68     Resp 04/09/20 1413 16     Temp 04/09/20 1413 98.1 F (36.7 C)     Temp Source 04/09/20 1413 Oral     SpO2 04/09/20 1413 97 %     Weight 04/09/20 1414 159 lb (72.1 kg)     Height 04/09/20 1414 5\' 4"  (1.626 m)     Head Circumference --      Peak Flow --      Pain Score 04/09/20 1414 6     Pain Loc --      Pain Edu? --      Excl. in GC? --     Constitutional: Alert and oriented. Well appearing and in no acute distress. Eyes: Conjunctivae are normal.  Head: Atraumatic. Nose: No congestion/rhinnorhea. Mouth/Throat: Mucous membranes are moist.  Throat appears mildly irritated if any Neck:  supple no lymphadenopathy noted Cardiovascular: Normal rate, regular rhythm. Heart sounds are normal Respiratory: Normal respiratory effort.  No retractions, lungs c t a  Abd: soft nontender bs normal all 4 quad, no rebound tenderness noted GU: deferred Musculoskeletal: FROM all extremities, warm and well perfused Neurologic:  Normal speech and language.  Skin:  Skin is warm, dry and intact. No rash noted. Psychiatric: Mood and affect are normal. Speech and behavior are normal.  However the patient is argumentative  ____________________________________________   LABS (all labs ordered are listed, but only abnormal results are displayed)  Labs Reviewed  RESP PANEL BY RT-PCR (FLU A&B,  COVID) [id number]  GROUP A STREP BY PCR   ____________________________________________   ____________________________________________  RADIOLOGY    ____________________________________________   PROCEDURES  Procedure(s) performed: No  Procedures    ____________________________________________   INITIAL IMPRESSION / ASSESSMENT AND PLAN / ED COURSE  Pertinent labs & imaging results that were available during my care of the patient were reviewed by me and considered in my medical decision making (see chart for details).   Patient is 29 year old female presents emergency department for Covid-like symptoms.  See HPI.  Physical exam basically is unremarkable.  Ordered respiratory panel which includes Covid and flu/AB.   Covid test, strep test, flu test are all negative.  I did explain findings to the patient.  She was encouraged to gargle salt water.  Take over-the-counter Imodium A-D if needed for diarrhea.  Patient is asking for  juice and peanut butter crackers.  I told her we could provide her with this but she is still be discharged.  She was given discharge papers by nursing staff and discharged stable condition.  ARENA LINDAHL was evaluated in Emergency Department on 04/09/2020 for the symptoms described in the history of present illness. She was evaluated in the context of the global COVID-19 pandemic, which necessitated consideration that the patient might be at risk for infection with the SARS-CoV-2 virus that causes COVID-19. Institutional protocols and algorithms that pertain to the evaluation of patients at risk for COVID-19 are in a state of rapid change based on information released by regulatory bodies including the CDC and federal and state organizations. These policies and algorithms were followed during the patient's care in the ED.    As part of my medical decision making, I reviewed the following data within the electronic MEDICAL RECORD NUMBER Nursing notes reviewed  and incorporated, Labs reviewed , Old chart reviewed, Notes from prior ED visits and [place] Controlled Substance Database  ____________________________________________   FINAL CLINICAL IMPRESSION(S) / ED DIAGNOSES  Final diagnoses:  Diarrhea, unspecified type  Sore throat      NEW MEDICATIONS STARTED DURING THIS VISIT:  New Prescriptions   No medications on file     Note:  This document was prepared using Dragon voice recognition software and may include unintentional dictation errors.    Faythe Ghee, PA-C 04/09/20 1645    Phineas Semen, MD 04/09/20 [phone]

## 2020-04-09 NOTE — ED Notes (Signed)
Patient presents to ED wanting a "covid test." States "my kids not tested yesterday and today I woke up feeling bad." When asked what symptoms she has she states sore throat, cough, diarrhea, burning eyes although she states her kids have pink eye.

## 2020-04-09 NOTE — ED Triage Notes (Signed)
Pt to ED via POV stating "I need to be tested for COVID". Pt states that she has a sore throat and diarrhea. Pt states that she would also like to have her eyes looked at because both of her kids were diagnosed with Pink eye yesterday. Pt states "I vomited one time yesterday but I do not want blood work, I just want to be tested for COVID". Pt states that she just does not feel good. Pt is in NAD.

## 2020-05-25 ENCOUNTER — Ambulatory Visit: Payer: Self-pay

## 2020-07-29 ENCOUNTER — Ambulatory Visit: Payer: Medicaid Other

## 2020-08-04 ENCOUNTER — Ambulatory Visit: Payer: Medicaid Other

## 2020-11-09 ENCOUNTER — Ambulatory Visit: Payer: Medicaid Other

## 2021-01-20 ENCOUNTER — Other Ambulatory Visit: Payer: Self-pay

## 2021-01-20 ENCOUNTER — Emergency Department
Admission: EM | Admit: 2021-01-20 | Discharge: 2021-01-20 | Disposition: A | Discharge status: Home / Self Care | Payer: Medicaid Other | Attending: Emergency Medicine | Admitting: Emergency Medicine

## 2021-01-20 ENCOUNTER — Emergency Department: Payer: Medicaid Other

## 2021-01-20 ENCOUNTER — Encounter: Payer: Self-pay | Admitting: Radiology

## 2021-01-20 DIAGNOSIS — Z91010 Allergy to peanuts: Secondary | ICD-10-CM | POA: Insufficient documentation

## 2021-01-20 DIAGNOSIS — J45909 Unspecified asthma, uncomplicated: Secondary | ICD-10-CM | POA: Diagnosis not present

## 2021-01-20 DIAGNOSIS — N760 Acute vaginitis: Secondary | ICD-10-CM | POA: Insufficient documentation

## 2021-01-20 DIAGNOSIS — N12 Tubulo-interstitial nephritis, not specified as acute or chronic: Secondary | ICD-10-CM | POA: Insufficient documentation

## 2021-01-20 DIAGNOSIS — R102 Pelvic and perineal pain: Secondary | ICD-10-CM | POA: Diagnosis present

## 2021-01-20 DIAGNOSIS — B9689 Other specified bacterial agents as the cause of diseases classified elsewhere: Secondary | ICD-10-CM | POA: Diagnosis not present

## 2021-01-20 LAB — CBC WITH DIFFERENTIAL/PLATELET
Abs Immature Granulocytes: 0.03 10*3/uL (ref 0.00–0.07)
Basophils Absolute: 0 10*3/uL (ref 0.0–0.1)
Basophils Relative: 0 %
Eosinophils Absolute: 0.1 10*3/uL (ref 0.0–0.5)
Eosinophils Relative: 1 %
HCT: 33.2 % — ABNORMAL LOW (ref 36.0–46.0)
Hemoglobin: 12 g/dL (ref 12.0–15.0)
Immature Granulocytes: 0 %
Lymphocytes Relative: 11 %
Lymphs Abs: 0.9 10*3/uL (ref 0.7–4.0)
MCH: 32.3 pg (ref 26.0–34.0)
MCHC: 36.1 g/dL — ABNORMAL HIGH (ref 30.0–36.0)
MCV: 89.5 fL (ref 80.0–100.0)
Monocytes Absolute: 0.6 10*3/uL (ref 0.1–1.0)
Monocytes Relative: 7 %
Neutro Abs: 6.2 10*3/uL (ref 1.7–7.7)
Neutrophils Relative %: 81 %
Platelets: 194 10*3/uL (ref 150–400)
RBC: 3.71 MIL/uL — ABNORMAL LOW (ref 3.87–5.11)
RDW: 12.4 % (ref 11.5–15.5)
WBC: 7.8 10*3/uL (ref 4.0–10.5)
nRBC: 0 % (ref 0.0–0.2)

## 2021-01-20 LAB — COMPREHENSIVE METABOLIC PANEL
ALT: 13 U/L (ref 0–44)
AST: 17 U/L (ref 15–41)
Albumin: 3.4 g/dL — ABNORMAL LOW (ref 3.5–5.0)
Alkaline Phosphatase: 65 U/L (ref 38–126)
Anion gap: 7 (ref 5–15)
BUN: 8 mg/dL (ref 6–20)
CO2: 25 mmol/L (ref 22–32)
Calcium: 8.5 mg/dL — ABNORMAL LOW (ref 8.9–10.3)
Chloride: 101 mmol/L (ref 98–111)
Creatinine, Ser: 0.91 mg/dL (ref 0.44–1.00)
GFR, Estimated: >60 mL/min (ref >60)
Glucose, Bld: 157 mg/dL — ABNORMAL HIGH (ref 70–99)
Potassium: 3.3 mmol/L — ABNORMAL LOW (ref 3.5–5.1)
Sodium: 133 mmol/L — ABNORMAL LOW (ref 135–145)
Total Bilirubin: 1 mg/dL (ref 0.3–1.2)
Total Protein: 6.8 g/dL (ref 6.5–8.1)

## 2021-01-20 LAB — URINALYSIS, COMPLETE (UACMP) WITH MICROSCOPIC
Bacteria, UA: NONE SEEN
Bilirubin Urine: NEGATIVE
Glucose, UA: NEGATIVE mg/dL
Ketones, ur: NEGATIVE mg/dL
Nitrite: NEGATIVE
Protein, ur: 100 mg/dL — AB
Specific Gravity, Urine: 1.016 (ref 1.005–1.030)
WBC, UA: >50 WBC/hpf — ABNORMAL HIGH (ref 0–5)
pH: 5 (ref 5.0–8.0)

## 2021-01-20 LAB — WET PREP, GENITAL
Sperm: NONE SEEN
Trich, Wet Prep: NONE SEEN
Yeast Wet Prep HPF POC: NONE SEEN

## 2021-01-20 LAB — CHLAMYDIA/NGC RT PCR (ARMC ONLY)
Chlamydia Tr: NOT DETECTED
N gonorrhoeae: NOT DETECTED

## 2021-01-20 LAB — LIPASE, BLOOD: Lipase: 22 U/L (ref 11–51)

## 2021-01-20 LAB — POC URINE PREG, ED: Preg Test, Ur: NEGATIVE

## 2021-01-20 MED ORDER — SODIUM CHLORIDE 0.9 % IV SOLN
1.0000 g | Freq: Once | INTRAVENOUS | Status: AC
  Administered 2021-01-20: 1 g via INTRAVENOUS
  Filled 2021-01-20: qty 10

## 2021-01-20 MED ORDER — CEFDINIR 300 MG PO CAPS: 300.0000 mg | ORAL_CAPSULE | Freq: Two times a day (BID) | ORAL | 0 refills | Status: AC

## 2021-01-20 MED ORDER — POTASSIUM CHLORIDE CRYS ER 20 MEQ PO TBCR
20.0000 meq | EXTENDED_RELEASE_TABLET | Freq: Once | ORAL | Status: AC
  Administered 2021-01-20: 20 meq via ORAL
  Filled 2021-01-20: qty 1

## 2021-01-20 MED ORDER — LACTATED RINGERS IV BOLUS
1000.0000 mL | Freq: Once | INTRAVENOUS | Status: AC
  Administered 2021-01-20: 1000 mL via INTRAVENOUS

## 2021-01-20 MED ORDER — METRONIDAZOLE 500 MG PO TABS: 500.0000 mg | ORAL_TABLET | Freq: Two times a day (BID) | ORAL | 0 refills | Status: AC

## 2021-01-20 MED ORDER — IOHEXOL 350 MG/ML SOLN
75.0000 mL | Freq: Once | INTRAVENOUS | Status: AC | PRN
  Administered 2021-01-20: 75 mL via INTRAVENOUS

## 2021-01-20 NOTE — ED Triage Notes (Signed)
Arrived to ed via pov with c/o right sided flank pain and pain when urinating x 2 weeks. Denies blood in urine. NAD noted at this time

## 2021-01-20 NOTE — ED Provider Notes (Signed)
[facility] Emergency Department Provider Note   ____________________________________________   Event Date/Time   First MD Initiated Contact with Patient 01/20/21 [phone]     (approximate)  I have reviewed the triage vital signs and the nursing notes.   HISTORY  Chief Complaint Flank Pain    HPI Tammy [name] is a 30 y.o. female with past medical history of asthma and polysubstance abuse who presents to the ED complaining of pelvic pain.  Patient reports that she has had approximately 2 weeks of increasing pain in her pelvic area.  She describes pain as constant and sharp, not exacerbated or alleviated by anything.  She has begun having dysuria along with pain radiating up towards her right flank.  She has noticed vaginal discharge with some intermittent vaginal bleeding, denies any bleeding currently.  She denies any fevers or chills, has not had any nausea or vomiting.  She is currently sexually active and does not use protection.        Past Medical History:  Diagnosis Date   ADHD (attention deficit hyperactivity disorder)    Anxiety    Asthma    Depression    Herpes genitalia    History of chlamydia    Substance abuse (HCC)     Patient Active Problem List   Diagnosis Date Noted   History of cesarean delivery 06/23/2019   BV (bacterial vaginosis) 03/30/2019   Pregnancy complicated by subutex maintenance, antepartum (HCC) 11/19/2018   Supervision of high risk pregnancy, antepartum 11/18/2018   Anxiety 03/29/2016   S/P cesarean section 02/16/2016   Marijuana use 07/26/2015    Past Surgical History:  Procedure Laterality Date   CESAREAN SECTION  09/30/2013   CESAREAN SECTION N/A 02/16/2016   Procedure: REPEAT CESAREAN SECTION;  Surgeon: Hildred Laser, MD;  Location: [facility] ORS;  Service: Obstetrics;  Laterality: N/A;  Female @ 1435 Wt: 6lb 13oz Apgars: 8/9   CESAREAN SECTION N/A 06/23/2019   Procedure: CESAREAN SECTION;  Surgeon: Nadara Mustard, MD;  Location: [facility] ORS;  Service: Obstetrics;  Laterality: N/A;   PEG TUBE PLACEMENT Bilateral 2011   TONSILLECTOMY  2011    Prior to Admission medications   Medication Sig Start Date End Date Taking? Authorizing Provider  cefdinir (OMNICEF) 300 MG capsule Take 1 capsule (300 mg total) by mouth 2 (two) times daily for 7 days. 01/20/21 01/27/21 Yes Chesley Noon, MD  metroNIDAZOLE (FLAGYL) 500 MG tablet Take 1 tablet (500 mg total) by mouth 2 (two) times daily for 7 days. 01/20/21 01/27/21 Yes Chesley Noon, MD  acetaminophen (TYLENOL) 500 MG tablet Take 2 tablets (1,000 mg total) by mouth every 6 (six) hours as needed for mild pain, moderate pain, fever or headache. 06/27/19   Farrel Conners, CNM  amphetamine-dextroamphetamine (ADDERALL XR) 20 MG 24 hr capsule Take 20 mg by mouth daily.    [provider]  buprenorphine (SUBUTEX) 8 MG SUBL SL tablet Place 8 mg under the tongue 3 (three) times daily.     [provider]  busPIRone (BUSPAR) 15 MG tablet Take 15 mg by mouth 2 (two) times daily.  01/12/19   [provider]  calcium carbonate (TUMS - DOSED IN MG ELEMENTAL CALCIUM) 500 MG chewable tablet Chew 1-2 tablets by mouth 3 (three) times daily.    [provider]  clonazePAM (KLONOPIN) 0.5 MG tablet Take 0.5 mg by mouth 2 (two) times daily.  01/12/19   [provider]  escitalopram (LEXAPRO) 20 MG tablet Take  20 mg by mouth daily.  01/12/19   [provider]  esomeprazole (NEXIUM) 20 MG capsule Take 1 capsule (20 mg total) by mouth daily as needed. 06/27/19   Farrel Conners, CNM  ibuprofen (ADVIL) 600 MG tablet Take 1 tablet (600 mg total) by mouth every 6 (six) hours as needed for mild pain, moderate pain or cramping. 06/27/19   Farrel Conners, CNM  Prenat w/o A-FeCbGl-DSS-FA-DHA (CITRANATAL ASSURE) 35-1 & 300 MG tablet Take 2 tablets by mouth daily. 06/04/19   Nadara Mustard, MD  Prenatal Vit-Fe Fumarate-FA (PRENATAL VITAMINS)  28-0.8 MG TABS Take 1 tablet by mouth daily. 06/27/19   Farrel Conners, CNM  traZODone (DESYREL) 50 MG tablet Take 50 mg by mouth at bedtime. 06/04/19   [provider]    Allergies Hydrocodone-acetaminophen, Morphine and related, Naloxone hcl, Other, Peanuts [peanut oil], and Penicillins  Family History  Problem Relation Age of Onset   Cancer Maternal Grandmother        thyroid    Social History Social History   Tobacco Use   Smoking status: Never   Smokeless tobacco: Never  Vaping Use   Vaping Use: Never used  Substance Use Topics   Alcohol use: No   Drug use: No    Review of Systems  Constitutional: No fever/chills Eyes: No visual changes. ENT: No sore throat. Cardiovascular: Denies chest pain. Respiratory: Denies shortness of breath. Gastrointestinal: Positive for abdominal pain, pelvic pain, and flank pain.  No nausea, no vomiting.  No diarrhea.  No constipation. Genitourinary: Positive for dysuria, vaginal bleeding, and vaginal discharge. Musculoskeletal: Negative for back pain. Skin: Negative for rash. Neurological: Negative for headaches, focal weakness or numbness.  ____________________________________________   PHYSICAL EXAM:  VITAL SIGNS: ED Triage Vitals  Enc Vitals Group     BP 01/20/21 0817 125/81     Pulse Rate 01/20/21 0817 (!) 126     Resp 01/20/21 0817 18     Temp 01/20/21 0817 99.1 F (37.3 C)     Temp Source 01/20/21 0817 Oral     SpO2 01/20/21 0817 98 %     Weight 01/20/21 0819 170 lb (77.1 kg)     Height 01/20/21 0819 5\' 4"  (1.626 m)     Head Circumference --      Peak Flow --      Pain Score 01/20/21 0818 7     Pain Loc --      Pain Edu? --      Excl. in GC? --     Constitutional: Alert and oriented. Eyes: Conjunctivae are normal. Head: Atraumatic. Nose: No congestion/rhinnorhea. Mouth/Throat: Mucous membranes are moist. Neck: Normal ROM Cardiovascular: Normal rate, regular rhythm. Grossly normal heart sounds.  2+  radial pulses bilaterally. Respiratory: Normal respiratory effort.  No retractions. Lungs CTAB. Gastrointestinal: Soft and diffusely tender to palpation with right CVA tenderness. No distention. Genitourinary: Purulent drainage noted from cervix with cervical motion tenderness and adnexal tenderness on the left. Musculoskeletal: No lower extremity tenderness nor edema. Neurologic:  Normal speech and language. No gross focal neurologic deficits are appreciated. Skin:  Skin is warm, dry and intact. No rash noted. Psychiatric: Mood and affect are normal. Speech and behavior are normal.  ____________________________________________   LABS (all labs ordered are listed, but only abnormal results are displayed)  Labs Reviewed  WET PREP, GENITAL - Abnormal; Notable for the following components:      Result Value   Clue Cells Wet Prep HPF POC PRESENT (*)  WBC, Wet Prep HPF POC MANY (*)    All other components within normal limits  URINALYSIS, COMPLETE (UACMP) WITH MICROSCOPIC - Abnormal; Notable for the following components:   Color, Urine AMBER (*)    APPearance CLOUDY (*)    Hgb urine dipstick SMALL (*)    Protein, ur 100 (*)    Leukocytes,Ua LARGE (*)    WBC, UA >50 (*)    All other components within normal limits  CBC WITH DIFFERENTIAL/PLATELET - Abnormal; Notable for the following components:   RBC 3.71 (*)    HCT 33.2 (*)    MCHC 36.1 (*)    All other components within normal limits  COMPREHENSIVE METABOLIC PANEL - Abnormal; Notable for the following components:   Sodium 133 (*)    Potassium 3.3 (*)    Glucose, Bld 157 (*)    Calcium 8.5 (*)    Albumin 3.4 (*)    All other components within normal limits  CHLAMYDIA/NGC RT PCR ([facility] ONLY)            URINE CULTURE  LIPASE, BLOOD  POC URINE PREG, ED     PROCEDURES  Procedure(s) performed (including Critical Care):  Procedures   ____________________________________________   INITIAL IMPRESSION / ASSESSMENT AND PLAN  / ED COURSE      30 year old female with past medical history of asthma and polysubstance abuse who presents to the ED with 2 weeks of increasing pelvic pain now with pain radiating up into her abdomen and right flank along with dysuria, vaginal bleeding, vaginal discharge.  Pelvic exam is concerning for PID and we will further assess with CT scan for tubo-ovarian abscess versus kidney stone with her flank pain.  Labs and UA are pending, patient currently declines any pain medication.  UA is concerning for infection and CT scan is consistent with pyelonephritis without any obstruction such as kidney stone noted.  There is no evidence of tubo-ovarian abscess, testing for gonorrhea and chlamydia is negative and I have lower suspicion for PID at this time.  Wet prep does show clue cells consistent with BV.  Patient tolerated IV dose of ceftriaxone here in the ED and she is appropriate for discharge home with prescription for cefdinir and Flagyl.  She was counseled to follow-up with PCP and OB/GYN, counseled to return to the ED for new or worsening symptoms.  Patient agrees with plan.      ____________________________________________   FINAL CLINICAL IMPRESSION(S) / ED DIAGNOSES  Final diagnoses:  Pyelonephritis  Bacterial vaginosis     ED Discharge Orders          Ordered    metroNIDAZOLE (FLAGYL) 500 MG tablet  2 times daily        01/20/21 1233    cefdinir (OMNICEF) 300 MG capsule  2 times daily        01/20/21 1233             Note:  This document was prepared using Dragon voice recognition software and may include unintentional dictation errors.    Chesley Noon, MD 01/20/21 [phone]

## 2021-01-20 NOTE — ED Notes (Signed)
Pt ambulatory to toilet in room.  

## 2021-01-22 LAB — URINE CULTURE: Culture: >=100000 — AB

## 2021-08-19 DEATH — deceased

## 2021-10-15 IMAGING — CT CT ABD-PELV W/ CM
2 of 4 series · 16 of 46 positions shown, 18 images · IV contrast (APPLIED)
Comparison: None.

CLINICAL DATA: Flank pain, kidney stone suspected

EXAM:
CT ABDOMEN AND PELVIS WITH CONTRAST
TECHNIQUE: Multidetector CT imaging of the abdomen and pelvis was performed
using the standard protocol following bolus administration of
intravenous contrast.
CONTRAST:  75mL OMNIPAQUE IOHEXOL 350 MG/ML SOLN

[Series 2: routine abd/pel with · axial · 0.79mm/px · z∈[-858,-412]mm · 13 of 97 slices shown, 15 images]
[im 4/97  soft-tissue]
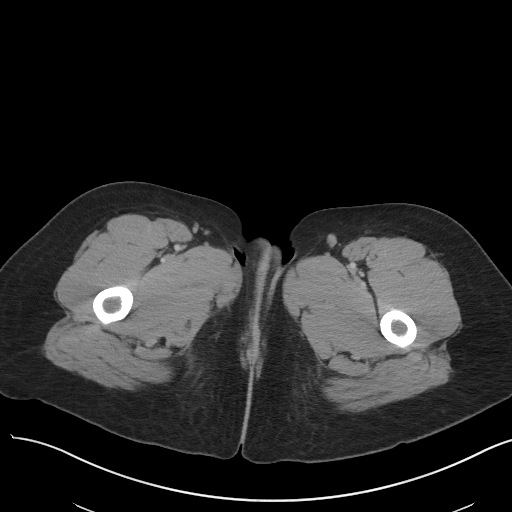
[im 4/97  bone]
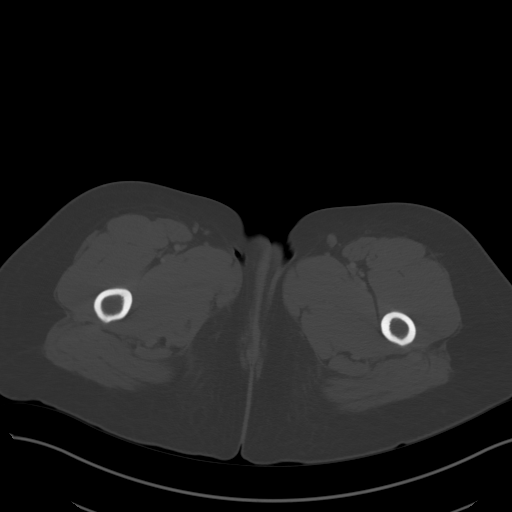
[im 12/97  soft-tissue]
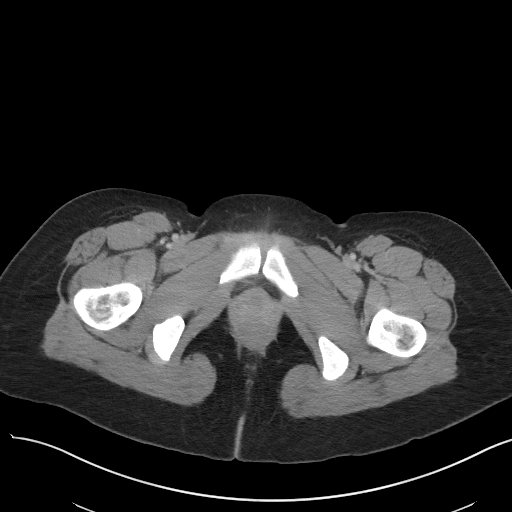
[im 19/97  soft-tissue]
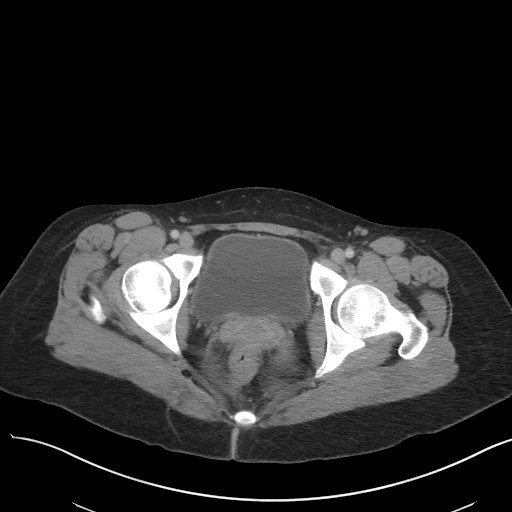
[im 26/97  soft-tissue]
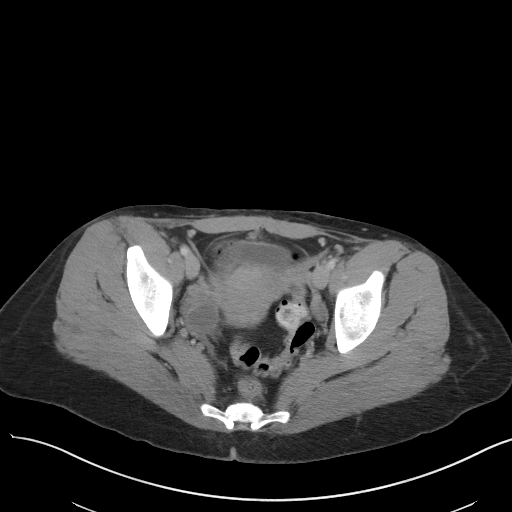
[im 34/97  soft-tissue]
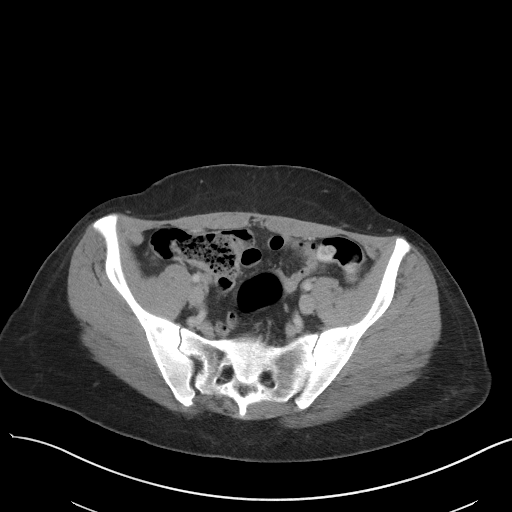
[im 41/97  soft-tissue]
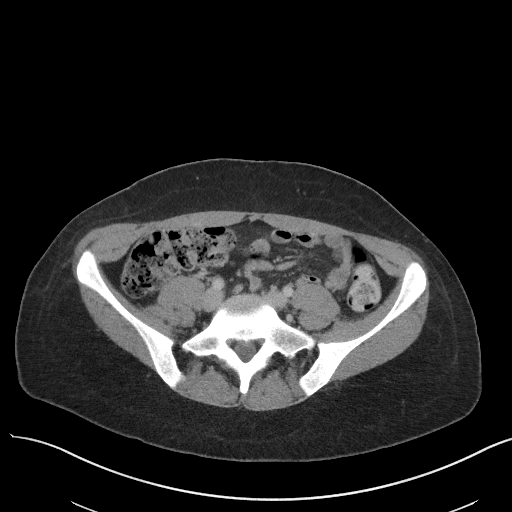
[im 49/97  soft-tissue]
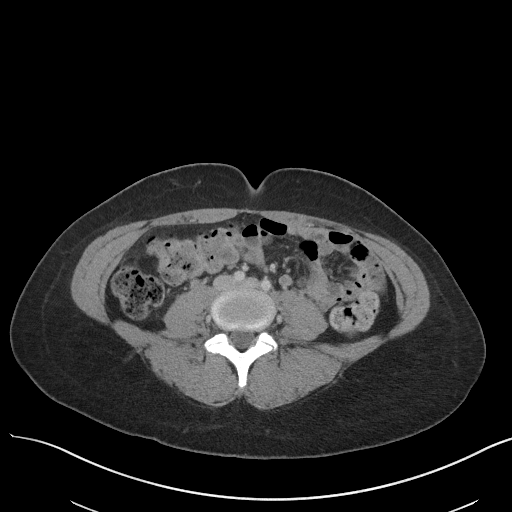
[im 56/97  soft-tissue]
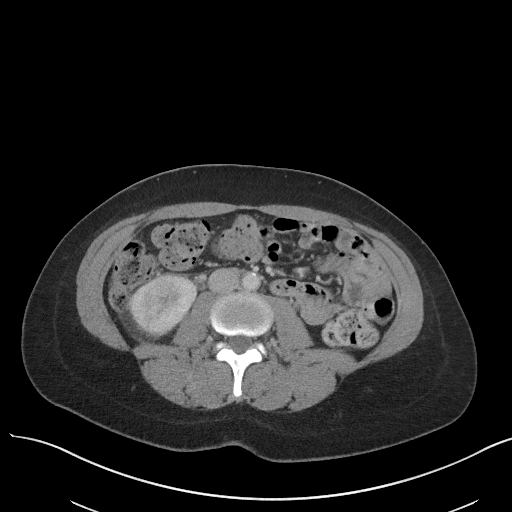
[im 63/97  soft-tissue]
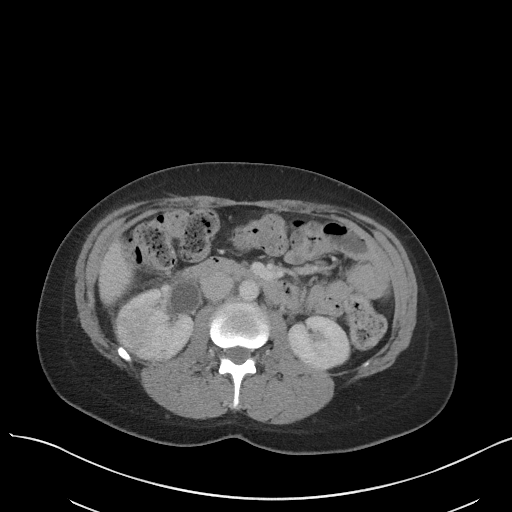
[im 63/97  bone]
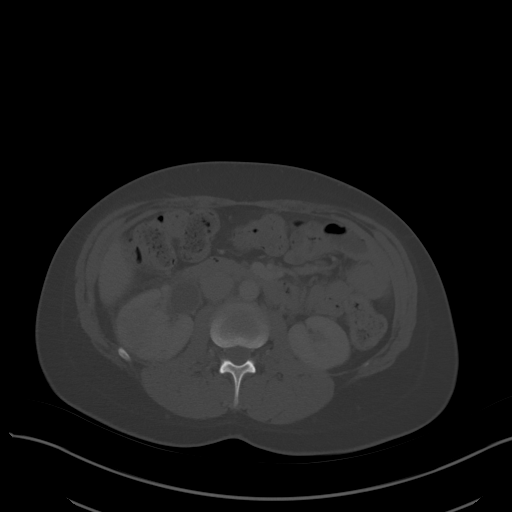
[im 71/97  soft-tissue]
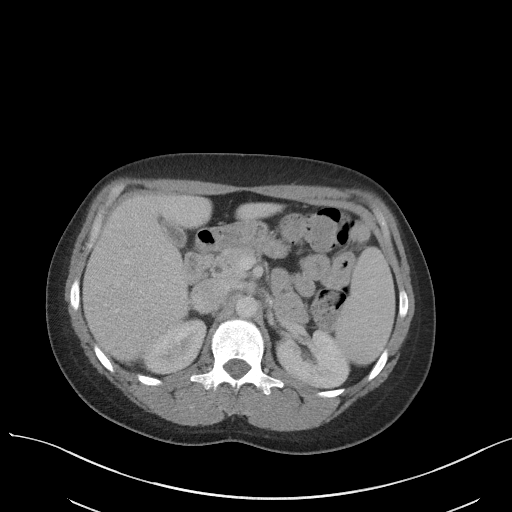
[im 78/97  soft-tissue]
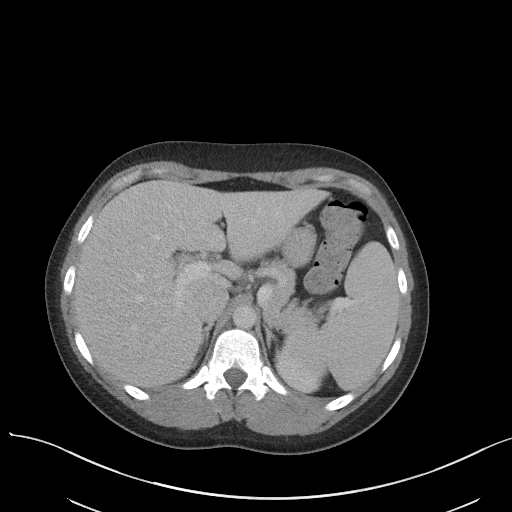
[im 85/97  soft-tissue]
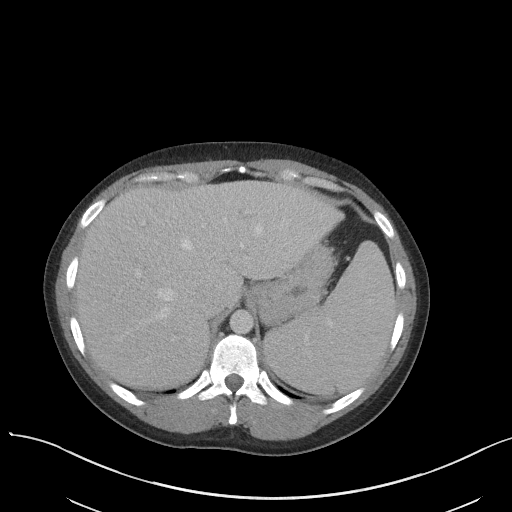
[im 93/97  soft-tissue]
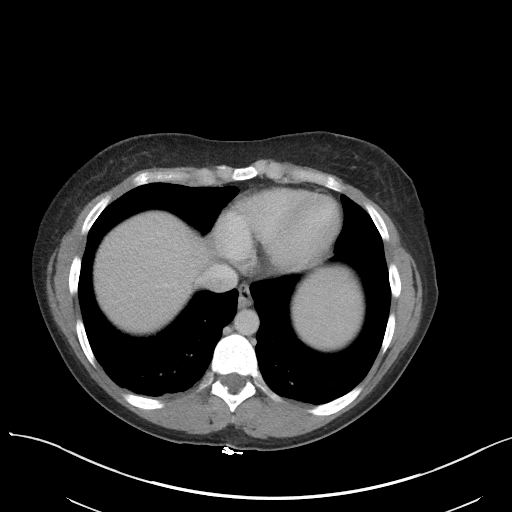

[Series 5: coronal st · coronal · 0.72mm/px · 3 of 86 slices shown]
[im 29/86  soft-tissue]
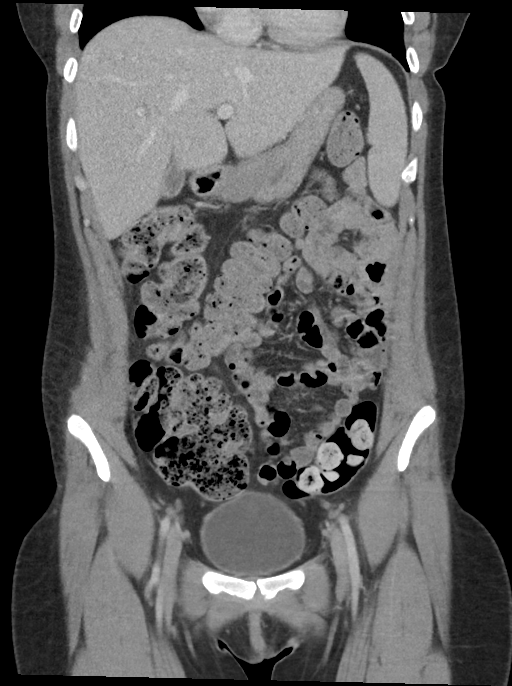
[im 38/86  soft-tissue]
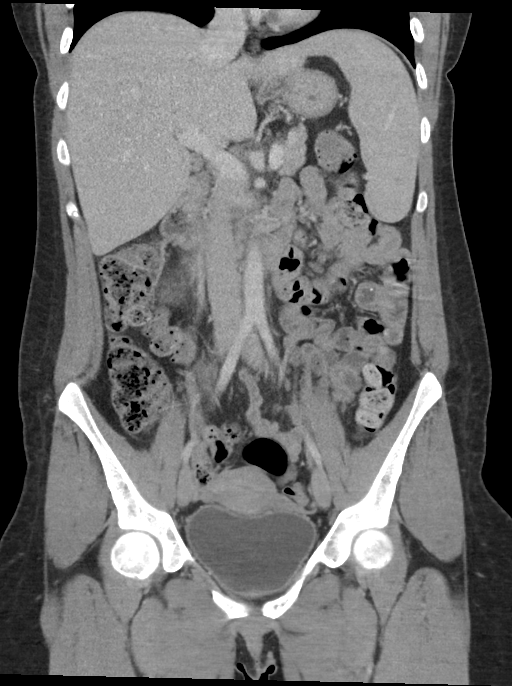
[im 48/86  soft-tissue]
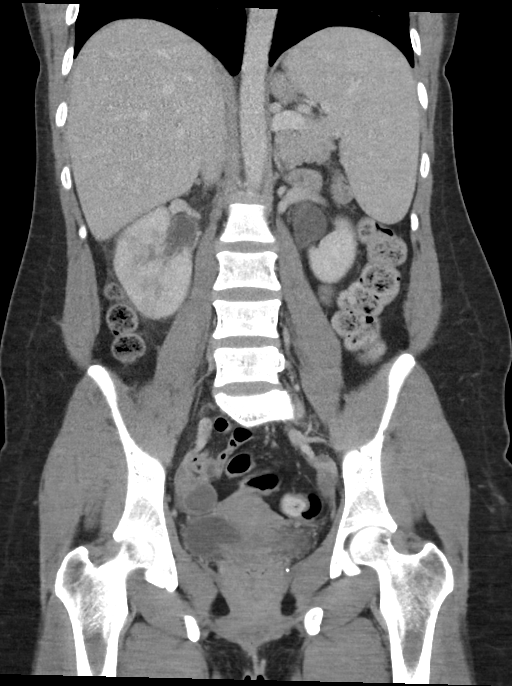

[16 of 46 positions shown; findings below may reference images not displayed]

FINDINGS: Lower chest: No acute abnormality.

Hepatobiliary: No focal liver abnormality. Gallbladder is
contracted. No biliary dilatation.

Pancreas: Unremarkable. No pancreatic ductal dilatation or
surrounding inflammatory changes.

Spleen: Mild splenomegaly measuring 14 cm.

Adrenals/Urinary Tract: Adrenals are unremarkable. Left kidney is
unremarkable. There are areas of hypoenhancement of the right
kidney. There is enhancement of the wall of the right collecting
system and ureter. No obstructing calculus. Bladder is unremarkable.

Stomach/Bowel: Stomach is within normal limits. Bowel is normal in
caliber. Normal appendix.

Vascular/Lymphatic: Minimal calcified plaque along the abdominal
aorta. No enlarged lymph nodes.

Reproductive: Uterus and bilateral adnexa are unremarkable.

Other: Trace free fluid in the pelvis is likely physiologic.

Musculoskeletal: No acute or significant osseous abnormality.
IMPRESSION: Ascending right urinary tract infection is suspected with right
pyelonephritis. No obstructing calculus.

Minimal calcified plaque along the abdominal aorta, unexpected at
this age.

Mild splenomegaly.
